# Patient Record
Sex: Female | Born: 1965 | Race: Black or African American | Hispanic: No | Smoking: Never smoker
Health system: Southern US, Community
[De-identification: ages and names within clinical notes are randomized; demographics above are authoritative.]

## PROBLEM LIST (undated history)

## (undated) ENCOUNTER — Emergency Department (HOSPITAL_COMMUNITY): Admission: EM | Payer: BLUE CROSS/BLUE SHIELD | Source: Home / Self Care

## (undated) DIAGNOSIS — M85611 Other cyst of bone, right shoulder: Secondary | ICD-10-CM

## (undated) DIAGNOSIS — E119 Type 2 diabetes mellitus without complications: Secondary | ICD-10-CM

## (undated) DIAGNOSIS — I10 Essential (primary) hypertension: Secondary | ICD-10-CM

## (undated) DIAGNOSIS — G43909 Migraine, unspecified, not intractable, without status migrainosus: Secondary | ICD-10-CM

## (undated) DIAGNOSIS — F1020 Alcohol dependence, uncomplicated: Secondary | ICD-10-CM

## (undated) DIAGNOSIS — M19019 Primary osteoarthritis, unspecified shoulder: Secondary | ICD-10-CM

## (undated) DIAGNOSIS — M75101 Unspecified rotator cuff tear or rupture of right shoulder, not specified as traumatic: Secondary | ICD-10-CM

## (undated) DIAGNOSIS — K219 Gastro-esophageal reflux disease without esophagitis: Secondary | ICD-10-CM

## (undated) HISTORY — PX: CHOLECYSTECTOMY: SHX55

## (undated) HISTORY — PX: ABDOMINAL HYSTERECTOMY: SHX81

## (undated) HISTORY — PX: GASTRIC BYPASS: SHX52

## (undated) HISTORY — PX: TONSILLECTOMY: SUR1361

---

## 2009-01-02 ENCOUNTER — Emergency Department (HOSPITAL_COMMUNITY): Admission: EM | Admit: 2009-01-02 | Discharge: 2009-01-02 | Payer: Self-pay | Admitting: Family Medicine

## 2009-01-24 ENCOUNTER — Emergency Department (HOSPITAL_COMMUNITY): Admission: EM | Admit: 2009-01-24 | Discharge: 2009-01-24 | Payer: Self-pay | Admitting: Family Medicine

## 2013-04-24 ENCOUNTER — Encounter (HOSPITAL_COMMUNITY): Payer: Self-pay | Admitting: Emergency Medicine

## 2013-04-24 ENCOUNTER — Emergency Department (HOSPITAL_COMMUNITY)
Admission: EM | Admit: 2013-04-24 | Discharge: 2013-04-24 | Disposition: A | Discharge status: Other Acute Inpatient Hospital | Payer: BC Managed Care – PPO | Attending: Emergency Medicine | Admitting: Emergency Medicine

## 2013-04-24 ENCOUNTER — Inpatient Hospital Stay (HOSPITAL_COMMUNITY)
Admission: AD | Admit: 2013-04-24 | Discharge: 2013-04-28 | DRG: 897 | Disposition: A | Discharge status: Home / Self Care | Payer: Federal, State, Local not specified - Other | Source: Intra-hospital | Attending: Psychiatry | Admitting: Psychiatry

## 2013-04-24 ENCOUNTER — Encounter (HOSPITAL_COMMUNITY): Payer: Self-pay

## 2013-04-24 DIAGNOSIS — F10239 Alcohol dependence with withdrawal, unspecified: Principal | ICD-10-CM | POA: Diagnosis present

## 2013-04-24 DIAGNOSIS — F4310 Post-traumatic stress disorder, unspecified: Secondary | ICD-10-CM | POA: Diagnosis present

## 2013-04-24 DIAGNOSIS — F10229 Alcohol dependence with intoxication, unspecified: Secondary | ICD-10-CM | POA: Insufficient documentation

## 2013-04-24 DIAGNOSIS — F1020 Alcohol dependence, uncomplicated: Secondary | ICD-10-CM | POA: Diagnosis present

## 2013-04-24 DIAGNOSIS — R1084 Generalized abdominal pain: Secondary | ICD-10-CM | POA: Insufficient documentation

## 2013-04-24 DIAGNOSIS — R45851 Suicidal ideations: Secondary | ICD-10-CM | POA: Insufficient documentation

## 2013-04-24 DIAGNOSIS — F10939 Alcohol use, unspecified with withdrawal, unspecified: Principal | ICD-10-CM | POA: Diagnosis present

## 2013-04-24 DIAGNOSIS — K219 Gastro-esophageal reflux disease without esophagitis: Secondary | ICD-10-CM | POA: Diagnosis present

## 2013-04-24 DIAGNOSIS — G4700 Insomnia, unspecified: Secondary | ICD-10-CM | POA: Diagnosis present

## 2013-04-24 DIAGNOSIS — F411 Generalized anxiety disorder: Secondary | ICD-10-CM | POA: Insufficient documentation

## 2013-04-24 DIAGNOSIS — E119 Type 2 diabetes mellitus without complications: Secondary | ICD-10-CM | POA: Insufficient documentation

## 2013-04-24 DIAGNOSIS — R197 Diarrhea, unspecified: Secondary | ICD-10-CM | POA: Insufficient documentation

## 2013-04-24 DIAGNOSIS — F329 Major depressive disorder, single episode, unspecified: Secondary | ICD-10-CM | POA: Diagnosis present

## 2013-04-24 DIAGNOSIS — F3289 Other specified depressive episodes: Secondary | ICD-10-CM | POA: Insufficient documentation

## 2013-04-24 DIAGNOSIS — R350 Frequency of micturition: Secondary | ICD-10-CM | POA: Insufficient documentation

## 2013-04-24 DIAGNOSIS — F10929 Alcohol use, unspecified with intoxication, unspecified: Secondary | ICD-10-CM

## 2013-04-24 HISTORY — DX: Type 2 diabetes mellitus without complications: E11.9

## 2013-04-24 HISTORY — DX: Alcohol dependence, uncomplicated: F10.20

## 2013-04-24 LAB — URINALYSIS, ROUTINE W REFLEX MICROSCOPIC
BILIRUBIN URINE: NEGATIVE
GLUCOSE, UA: NEGATIVE mg/dL
HGB URINE DIPSTICK: NEGATIVE
Ketones, ur: NEGATIVE mg/dL
Leukocytes, UA: NEGATIVE
NITRITE: NEGATIVE
Protein, ur: NEGATIVE mg/dL
SPECIFIC GRAVITY, URINE: 1.004 — AB (ref 1.005–1.030)
Urobilinogen, UA: 0.2 mg/dL (ref 0.0–1.0)
pH: 5.5 (ref 5.0–8.0)

## 2013-04-24 LAB — RAPID URINE DRUG SCREEN, HOSP PERFORMED
Amphetamines: NOT DETECTED
BENZODIAZEPINES: NOT DETECTED
Barbiturates: NOT DETECTED
COCAINE: NOT DETECTED
Opiates: NOT DETECTED
TETRAHYDROCANNABINOL: NOT DETECTED

## 2013-04-24 LAB — COMPREHENSIVE METABOLIC PANEL
ALT: 33 U/L (ref 0–35)
AST: 42 U/L — ABNORMAL HIGH (ref 0–37)
Albumin: 4.3 g/dL (ref 3.5–5.2)
Alkaline Phosphatase: 122 U/L — ABNORMAL HIGH (ref 39–117)
BUN: 17 mg/dL (ref 6–23)
CALCIUM: 9.4 mg/dL (ref 8.4–10.5)
CO2: 27 meq/L (ref 19–32)
Chloride: 105 mEq/L (ref 96–112)
Creatinine, Ser: 0.95 mg/dL (ref 0.50–1.10)
GFR, EST AFRICAN AMERICAN: 81 mL/min — AB (ref >90)
GFR, EST NON AFRICAN AMERICAN: 70 mL/min — AB (ref >90)
GLUCOSE: 110 mg/dL — AB (ref 70–99)
Potassium: 4.4 mEq/L (ref 3.7–5.3)
SODIUM: 144 meq/L (ref 137–147)
TOTAL PROTEIN: 8 g/dL (ref 6.0–8.3)
Total Bilirubin: <0.2 mg/dL — ABNORMAL LOW (ref 0.3–1.2)

## 2013-04-24 LAB — CBC
HCT: 44 % (ref 36.0–46.0)
HEMOGLOBIN: 14.7 g/dL (ref 12.0–15.0)
MCH: 30.1 pg (ref 26.0–34.0)
MCHC: 33.4 g/dL (ref 30.0–36.0)
MCV: 90 fL (ref 78.0–100.0)
Platelets: 290 10*3/uL (ref 150–400)
RBC: 4.89 MIL/uL (ref 3.87–5.11)
RDW: 13 % (ref 11.5–15.5)
WBC: 6.4 10*3/uL (ref 4.0–10.5)

## 2013-04-24 LAB — SALICYLATE LEVEL: Salicylate Lvl: <2 mg/dL — ABNORMAL LOW (ref 2.8–20.0)

## 2013-04-24 LAB — ACETAMINOPHEN LEVEL: Acetaminophen (Tylenol), Serum: <15 ug/mL (ref 10–30)

## 2013-04-24 LAB — GLUCOSE, CAPILLARY: Glucose-Capillary: 101 mg/dL — ABNORMAL HIGH (ref 70–99)

## 2013-04-24 LAB — ETHANOL: Alcohol, Ethyl (B): 258 mg/dL — ABNORMAL HIGH (ref 0–11)

## 2013-04-24 MED ORDER — MAGNESIUM HYDROXIDE 400 MG/5ML PO SUSP: 30.0000 mL | Freq: Every day | ORAL | Status: DC | PRN

## 2013-04-24 MED ORDER — CHLORDIAZEPOXIDE HCL 25 MG PO CAPS
25.0000 mg | ORAL_CAPSULE | Freq: Four times a day (QID) | ORAL | Status: AC | PRN
Start: 2013-04-24 — End: 2013-04-27
  Administered 2013-04-25 – 2013-04-27 (×4): 25 mg via ORAL
  Filled 2013-04-24 (×4): qty 1

## 2013-04-24 MED ORDER — ADULT MULTIVITAMIN W/MINERALS CH
1.0000 | ORAL_TABLET | Freq: Every day | ORAL | Status: DC
  Administered 2013-04-25 – 2013-04-28 (×4): 1 via ORAL
  Filled 2013-04-24 (×6): qty 1

## 2013-04-24 MED ORDER — PANTOPRAZOLE SODIUM 40 MG PO TBEC
40.0000 mg | DELAYED_RELEASE_TABLET | Freq: Every day | ORAL | Status: DC
  Administered 2013-04-25 – 2013-04-28 (×4): 40 mg via ORAL
  Filled 2013-04-24 (×6): qty 1

## 2013-04-24 MED ORDER — CHLORDIAZEPOXIDE HCL 25 MG PO CAPS
25.0000 mg | ORAL_CAPSULE | Freq: Three times a day (TID) | ORAL | Status: AC
  Administered 2013-04-26 – 2013-04-27 (×3): 25 mg via ORAL
  Filled 2013-04-24 (×3): qty 1

## 2013-04-24 MED ORDER — LOPERAMIDE HCL 2 MG PO CAPS
2.0000 mg | ORAL_CAPSULE | ORAL | Status: AC | PRN
  Administered 2013-04-26: 4 mg via ORAL
  Administered 2013-04-26: 2 mg via ORAL
  Filled 2013-04-24: qty 2
  Filled 2013-04-24: qty 1

## 2013-04-24 MED ORDER — RAMELTEON 8 MG PO TABS
8.0000 mg | ORAL_TABLET | Freq: Every evening | ORAL | Status: DC | PRN
  Administered 2013-04-24: 8 mg via ORAL
  Filled 2013-04-24: qty 1

## 2013-04-24 MED ORDER — THIAMINE HCL 100 MG/ML IJ SOLN
100.0000 mg | Freq: Once | INTRAMUSCULAR | Status: AC
  Administered 2013-04-24: 100 mg via INTRAMUSCULAR
  Filled 2013-04-24: qty 2

## 2013-04-24 MED ORDER — CHLORDIAZEPOXIDE HCL 25 MG PO CAPS
25.0000 mg | ORAL_CAPSULE | Freq: Four times a day (QID) | ORAL | Status: AC
  Administered 2013-04-24 – 2013-04-26 (×6): 25 mg via ORAL
  Filled 2013-04-24 (×6): qty 1

## 2013-04-24 MED ORDER — CHLORDIAZEPOXIDE HCL 25 MG PO CAPS
25.0000 mg | ORAL_CAPSULE | ORAL | Status: AC
Start: 2013-04-27 — End: 2013-04-28
  Administered 2013-04-27 – 2013-04-28 (×2): 25 mg via ORAL
  Filled 2013-04-24 (×2): qty 1

## 2013-04-24 MED ORDER — ONDANSETRON 4 MG PO TBDP
4.0000 mg | ORAL_TABLET | Freq: Four times a day (QID) | ORAL | Status: AC | PRN
  Administered 2013-04-25 – 2013-04-27 (×6): 4 mg via ORAL
  Filled 2013-04-24 (×8): qty 1

## 2013-04-24 MED ORDER — ACETAMINOPHEN 325 MG PO TABS
650.0000 mg | ORAL_TABLET | Freq: Four times a day (QID) | ORAL | Status: DC | PRN
  Administered 2013-04-24 – 2013-04-25 (×3): 650 mg via ORAL
  Filled 2013-04-24 (×4): qty 2

## 2013-04-24 MED ORDER — VITAMIN B-1 100 MG PO TABS
100.0000 mg | ORAL_TABLET | Freq: Every day | ORAL | Status: DC
  Administered 2013-04-25 – 2013-04-28 (×4): 100 mg via ORAL
  Filled 2013-04-24 (×6): qty 1

## 2013-04-24 MED ORDER — CHLORDIAZEPOXIDE HCL 25 MG PO CAPS: 25.0000 mg | ORAL_CAPSULE | Freq: Every day | ORAL | Status: DC

## 2013-04-24 MED ORDER — FLUOXETINE HCL 20 MG PO CAPS
20.0000 mg | ORAL_CAPSULE | Freq: Every day | ORAL | Status: DC
  Administered 2013-04-24 – 2013-04-25 (×2): 20 mg via ORAL
  Filled 2013-04-24 (×5): qty 1

## 2013-04-24 MED ORDER — HYDROXYZINE HCL 25 MG PO TABS
25.0000 mg | ORAL_TABLET | Freq: Four times a day (QID) | ORAL | Status: AC | PRN
  Administered 2013-04-25 – 2013-04-27 (×3): 25 mg via ORAL
  Filled 2013-04-24 (×3): qty 1

## 2013-04-24 MED ORDER — ALUM & MAG HYDROXIDE-SIMETH 200-200-20 MG/5ML PO SUSP: 30.0000 mL | ORAL | Status: DC | PRN

## 2013-04-24 MED ORDER — METFORMIN HCL 500 MG PO TABS
500.0000 mg | ORAL_TABLET | Freq: Three times a day (TID) | ORAL | Status: DC
  Administered 2013-04-25 – 2013-04-28 (×11): 500 mg via ORAL
  Filled 2013-04-24 (×17): qty 1

## 2013-04-24 NOTE — ED Notes (Signed)
Called and left another voicemail for [place] transportation regarding [facility].

## 2013-04-24 NOTE — Progress Notes (Signed)
[facility] been [facility].  Dr. Dub MikesLugo is the accepting doctor.  Writer informed the nurse of the patients disposition.  The nurse working with the patient will arrange transportation.  The incoming TTS counselor will be complete the support paperwork.

## 2013-04-24 NOTE — ED Notes (Signed)
TTS speaking with pt/pt's family.

## 2013-04-24 NOTE — ED Notes (Addendum)
Suicidal intoxicated patient sent from home saying she wants to take pills and die.  Patient has history of alcoholism and depression.  Patient takes prozac for depression.

## 2013-04-24 NOTE — BH Assessment (Signed)
Patient signed support paperwork consisting of 'voluntary admission and consent for treatment' and 'consent to release information' to both daughter and sponsor and sponsor at 4:08 PM. Paperwork [facility] at 4:15PM. Paperwork given to patient's RN, Eliezer Loftsarrie Hall, as she will arrange transportation thought [employer] [employer] with [facility]. Carney Bernatherine C Luverna Degenhart, LCSW

## 2013-04-24 NOTE — ED Notes (Signed)
Left message with [facility] [place] [facility] regarding [facility].

## 2013-04-24 NOTE — ED Provider Notes (Signed)
CSN: [id number]     Arrival date & time 04/24/13  1329 History  This chart was scribed for non-physician practitioner working with Gilda Crease, MD by Ashley Jacobs, ED scribe. This patient was seen in room WTR4/WLPT4 and the patient's care was started at 1:43 PM.  First MD Initiated Contact with Patient 04/24/13 1333     Chief Complaint  Patient presents with  . Suicidal     (Consider location/radiation/quality/duration/timing/severity/associated sxs/prior Treatment) The history is provided by the patient and medical records. No language interpreter was used.   HPI Comments: Janice Santos is a 48 y.o. female who presents to the Emergency Department via EMS after her daughter reported the patient stated  "I just want to take pills and die".  Stated her family would be better off without her.  Pt states, " I don't know why I'm here".  She is unaware of how she arrived to the ED. States she is depressed and has been depressed for a long time.  Denies SOB or any pain.    Level V caveat for intoxication Past Medical History  Diagnosis Date  . Alcoholism   . Diabetes mellitus without complication    History reviewed. No pertinent past surgical history. No family history on file. History  Substance Use Topics  . Smoking status: Not on file  . Smokeless tobacco: Not on file  . Alcohol Use: Not on file   OB History   Grav Para Term Preterm Abortions TAB SAB Ect Mult Living                 Review of Systems  Unable to perform ROS: Psychiatric disorder  Cardiovascular: Negative for chest pain.  Gastrointestinal: Positive for diarrhea. Negative for vomiting and abdominal pain.  Genitourinary: Positive for frequency.  Psychiatric/Behavioral: Positive for dysphoric mood.      Allergies  Review of patient's allergies indicates not on file.  Home Medications  No current outpatient prescriptions on file. BP 116/76  Pulse 108  Temp(Src) 98.6 F (37 C) (Oral)  Resp  20  SpO2 97% Physical Exam  Nursing note and vitals reviewed. Constitutional: She is oriented to person, place, and time. She appears well-developed and well-nourished. No distress.  Pt is wearing her dress backwards.  HENT:  Head: Normocephalic and atraumatic.  Eyes: EOM are normal.  Neck: Normal range of motion. Neck supple.  Cardiovascular: Normal rate and regular rhythm.   Pulmonary/Chest: Effort normal and breath sounds normal. No respiratory distress. She has no wheezes. She has no rales.  Abdominal: Soft. She exhibits no distension. There is tenderness (diffuse lower abdominal pain). There is no rebound and no guarding.  Musculoskeletal: Normal range of motion.  Neurological: She is alert and oriented to person, place, and time.  Skin: Skin is warm and dry. She is not diaphoretic.  Psychiatric: She exhibits a depressed mood. She expresses suicidal ideation. She expresses suicidal plans.  Crying throughout interview with head in hands.      ED Course  Procedures (including critical care time) DIAGNOSTIC STUDIES: Oxygen Saturation is 97% on room air, normal by my interpretation.    COORDINATION OF CARE:  1:48 PM Discussed course of care with pt . Pt understands and agrees.   Labs Review Labs Reviewed  ETHANOL - Abnormal; Notable for the following:    Alcohol, Ethyl (B) 258 (*)    All other components within normal limits  COMPREHENSIVE METABOLIC PANEL - Abnormal; Notable for the following:    Glucose, Bld  110 (*)    AST 42 (*)    Alkaline Phosphatase 122 (*)    Total Bilirubin <0.2 (*)    GFR calc non Af Amer 70 (*)    GFR calc Af Amer 81 (*)    All other components within normal limits  SALICYLATE LEVEL - Abnormal; Notable for the following:    Salicylate Lvl <2.0 (*)    All other components within normal limits  URINALYSIS, ROUTINE W REFLEX MICROSCOPIC - Abnormal; Notable for the following:    Specific Gravity, Urine 1.004 (*)    All other components within  normal limits  CBC  URINE RAPID DRUG SCREEN (HOSP PERFORMED)  ACETAMINOPHEN LEVEL   Imaging Review No results found.  EKG Interpretation    Date/Time:  Thursday April 24 2013 14:13:01 EST Ventricular Rate:  116 PR Interval:  155 QRS Duration: 94 QT Interval:  335 QTC Calculation: 465 R Axis:   -44 Text Interpretation:  Sinus tachycardia Left axis deviation Anteroseptal infarct, old No previous tracing Confirmed by POLLINA  MD, CHRISTOPHER ([id number]) on 04/24/2013 2:17:50 PM           5:12 PM Patient found to be attempting to hang herself with the cords in the room.  Pt has been accepted at behavioral health.    MDM  I personally performed the services described in this documentation, which was scribed in my presence. The recorded information has been reviewed and is accurate.   Final diagnoses:  Suicidal ideation  Alcohol intoxication    Pt is intoxicated, ETOH 258, brought in to ED after she told daughter this morning she wanted to take pills and kill herself.  She was also found to be attempted to strangle herself with cords in the exam room.  Medically cleared for inpatient psychiatric care.  Accepted to Behavioral Health by Dr Dub MikesLugo.      Trixie Dredgemily Bryten Maher, PA-C 04/24/13 1719

## 2013-04-24 NOTE — Progress Notes (Signed)
Writer informed Dr. Ladona Ridgelaylor and Catha NottinghamJamison, NP of the consult request.

## 2013-04-24 NOTE — Consult Note (Signed)
Subjective:  Patient has been drinking Merlot daily.  She has tried to maintain sobriety with AA and a sponsor with little to no success.  Janice Santos drank last night and took 3 Tylenol PM to sleep but blacked out.  She called her daughter and said she wanted to kill herself.  Her daughter called EMS and brought her to the ED.  Psychiatric Specialty Exam: Physical Exam  Constitutional: She is oriented to person, place, and time. She appears well-developed and well-nourished.  HENT:  Head: Normocephalic and atraumatic.  Neck: Normal range of motion.  Respiratory: Effort normal.  Musculoskeletal: Normal range of motion.  Neurological: She is alert and oriented to person, place, and time.    Review of Systems  Constitutional: Negative.   HENT: Negative.   Eyes: Negative.   Respiratory: Negative.   Cardiovascular: Negative.   Gastrointestinal: Positive for diarrhea.  Genitourinary: Negative.   Musculoskeletal: Negative.   Skin: Negative.   Neurological: Negative.   Endo/Heme/Allergies: Negative.   Psychiatric/Behavioral: Positive for depression and substance abuse. The patient is nervous/anxious.     Blood pressure 116/76, pulse 108, temperature 98.6 F (37 C), temperature source Oral, resp. rate 20, SpO2 97.00%.There is no height or weight on file to calculate BMI.  General Appearance: Casual  Eye Contact::  Fair  Speech:  Normal Rate  Volume:  Normal  Mood:  Depressed  Affect:  Tearful  Thought Process:  Coherent  Orientation:  Full (Time, Place, and Person)  Thought Content:  WDL  Suicidal Thoughts:  No  Homicidal Thoughts:  No  Memory:  Immediate;   Fair Recent;   Poor Remote;   Fair  Judgement:  Poor  Insight:  Fair  Psychomotor Activity:  Decreased  Concentration:  Fair  Recall:  Fair  Akathisia:  No  Handed:  Right  AIMS (if indicated):     Assets:  Leisure Time Physical Health Resilience Social Support  Sleep:      Herminio HeadsJamison Y. Lord, PMH-NP 04/24/2013

## 2013-04-25 ENCOUNTER — Encounter (HOSPITAL_COMMUNITY): Payer: Self-pay | Admitting: Psychiatry

## 2013-04-25 DIAGNOSIS — F1994 Other psychoactive substance use, unspecified with psychoactive substance-induced mood disorder: Secondary | ICD-10-CM

## 2013-04-25 DIAGNOSIS — F329 Major depressive disorder, single episode, unspecified: Secondary | ICD-10-CM | POA: Diagnosis present

## 2013-04-25 DIAGNOSIS — F4310 Post-traumatic stress disorder, unspecified: Secondary | ICD-10-CM | POA: Diagnosis present

## 2013-04-25 NOTE — [facility] Suicide Risk Assessment (Signed)
[facility] INPATIENT:  Family/Significant Other Suicide Prevention Education  Suicide Prevention Education:  Education Completed; Patient daughter Estill Batten(Ikkia Hardoson) has been identified by the patient as the family member/significant other with whom the patient will be residing, and identified as the person(s) who will aid the patient in the event of a mental health crisis (suicidal ideations/suicide attempt).  With written consent from the patient, the family member/significant other has been provided the following suicide prevention education, prior to the and/or following the discharge of the patient.  The suicide prevention education provided includes the following:  Suicide risk factors  Suicide prevention and interventions  National Suicide Hotline telephone number  [facility] assessment telephone number  [facility] Emergency Assistance 911  [facility] and/or Residential Mobile Crisis Unit telephone number  Request made of family/significant other to:  Remove weapons (e.g., guns, rifles, knives), all items previously/currently identified as safety concern.    Remove drugs/medications (over-the-counter, prescriptions, illicit drugs), all items previously/currently identified as a safety concern.  The family member/significant other verbalizes understanding of the suicide prevention education information provided.  The family member/significant other agrees to remove the items of safety concern listed above.  Raye SorrowCoble, Neilani Duffee N 04/25/2013, 12:39 PM

## 2013-04-25 NOTE — Progress Notes (Signed)
Patient ID: Janice Santos, female   DOB: 12/26/1965, 48 y.o.   MRN: [id number] Admission note: D:Patient is a  Voluntary admission in no acute distress for alcohol detox. Pt report consuming a bottle of wine daily. Pt states sober since March 20, 2013. [place] close to her daughter and grandchildren. Pt stated she drinks till she blackout but denies seizures.  A: Pt admitted to unit per protocol, skin assessment and belonging search done. No skin issues noted. Consent signed by pt. Pt educated on therapeutic milieu rules. Pt was introduced to milieu by nursing staff. Fall risk safety plan explained to the patient. Meal offered and pt accepted.15 minutes checks started for safety.  R: Pt was receptive to education. Writer offered support.

## 2013-04-25 NOTE — Progress Notes (Signed)
LCSW spoke with patient daughter at length regarding concerns and expectations of treatment. Daughter reports things have been going well at home and has enjoyed having her mom here and living with them. Reports she wants her to return and wants to do everything to help her mom with regards to outpatient treatment.  Daughter reports she has seen her mother spiral more downhill in the last 5 years and is fearful of losing her mom.  LCSW and daughter discussed addiction, daughter has been looking up more information about addiction.  Reports strong support for mother, no current concerns, but will be visiting this weekend.  Daughter is agreeable to be part of treatment and will give more insight after the weekend and once patient has complete more in treatment.  Suicide education was complete and at this time daughter has no questions or concerns.  Ashley JacobsHannah Nail, MSW, LCSW Clinical Lead [phone]

## 2013-04-25 NOTE — [facility] Suicide Risk Assessment (Signed)
Suicide Risk Assessment  Admission Assessment     Nursing information obtained from:  Patient Demographic factors:  Divorced or widowed Current Mental Status:  Self-harm thoughts;Self-harm behaviors Loss Factors:  NA Historical Factors:  Domestic violence Risk Reduction Factors:  Employed;Positive social support;Living with another person, especially a relative Total Time spent with patient: 1 hour  CLINICAL FACTORS:   Depression:   Comorbid alcohol abuse/dependence Insomnia Alcohol/Substance Abuse/Dependencies  COGNITIVE FEATURES THAT CONTRIBUTE TO RISK:  Closed-mindedness Polarized thinking Thought constriction (tunnel vision)    SUICIDE RISK:   Moderate:  Frequent suicidal ideation with limited intensity, and duration, some specificity in terms of plans, no associated intent, good self-control, limited dysphoria/symptomatology, some risk factors present, and identifiable protective factors, including available and accessible social support.  PLAN OF CARE: Supportive approach/coping skills/ relapse prevention                               [facility] Detox protocol                               Reassess and address the co morbidities  I certify that inpatient services furnished can reasonably be expected to improve the patient's condition.  Whittney Steenson A 04/25/2013, 3:31 PM

## 2013-04-25 NOTE — ED Provider Notes (Signed)
Medical screening examination/treatment/procedure(s) were performed by non-physician practitioner and as supervising physician I was immediately available for consultation/collaboration.  EKG Interpretation    Date/Time:  Thursday April 24 2013 14:13:01 EST Ventricular Rate:  116 PR Interval:  155 QRS Duration: 94 QT Interval:  335 QTC Calculation: 465 R Axis:   -44 Text Interpretation:  Sinus tachycardia Left axis deviation Anteroseptal infarct, old No previous tracing Confirmed by Blinda LeatherwoodPOLLINA  MD, CHRISTOPHER ([id number]) on 04/24/2013 2:17:50 PM              Gilda Creasehristopher J. Pollina, MD 04/25/13 [phone]

## 2013-04-25 NOTE — [facility] Group Notes (Signed)
[facility] LCSW Group Therapy  04/25/2013  1:15 PM   Type of Therapy:  Group Therapy  Participation Level:  Active  Participation Quality:  Appropriate, Attentive, Sharing and Supportive  Affect:  Depressed and Flat  Cognitive:  Alert and Oriented  Insight:  Developing/Improving and Engaged  Engagement in Therapy:  Developing/Improving and Engaged  Modes of Intervention:  Clarification, Confrontation, Discussion, Education, Exploration, Limit-setting, Orientation, Problem-solving, Rapport Building, Dance movement psychotherapisteality Testing, Socialization and Support  Summary of Progress/Problems: The topic for today was feelings about relapse.  Pt discussed what relapse prevention is to them and identified triggers that they are on the path to relapse.  Pt processed their feeling towards relapse and was able to relate to peers.  Pt discussed coping skills that can be used for relapse prevention.  Pt was able to process what led to this admission, sharing that she was sober for 30 days and doesn't know why she relapsed.  Pt states that she wants to understand why she was doing so well and the sight of the wine bottle triggered her to buy it.  Pt states that she was able to stay sober with AA meetings and a sponsor and was able to realize that she should've called her sponsor while in the store before buying it.  Pt participated in a discussion with peers about having control over her addiction/disorders vs it having control over her.  Pt was able to identify what she is able to control in her addiction, to prevent future relapse.  Pt actively participated and was engaged in group discussion.    Janice IvanChelsea Horton, LCSW 04/25/2013 2:13 PM

## 2013-04-25 NOTE — Progress Notes (Signed)
Adult Psychoeducational Group Note  Date:  04/25/2013 Time:  12:45 PM  Group Topic/Focus:  Early Warning Signs:   The focus of this group is to help patients identify signs or symptoms they exhibit before slipping into an unhealthy state or crisis.  Participation Level:  Active  Participation Quality:  Appropriate  Affect:  Appropriate  Cognitive:  Appropriate  Insight: Appropriate  Engagement in Group:  Engaged  Modes of Intervention:  Discussion, Education and Support  Additional Comments:  Pt stated she does not know what happened to cause her to relapse. Pt stated she had one month clean and went into [facility] for some items that she needed and ended up also picking up a bottle of wine. Pt stated she does not know how to cope or dealt with her addiction and just seeing the wine in the store must have been a trigger for her.  Caswell CorwinOwen, Chinonso Linker C 04/25/2013, 12:45 PM

## 2013-04-25 NOTE — Tx Team (Signed)
Initial Interdisciplinary Treatment Plan  PATIENT STRENGTHS: (choose at least two) Ability for insight Average or above average intelligence Capable of independent living Motivation for treatment/growth Supportive family/friends  PATIENT STRESSORS: Substance abuse   PROBLEM LIST: Problem List/Patient Goals Date to be addressed Date deferred Reason deferred Estimated date of resolution  Alcohol dependence 04/24/2013     depression 04/24/2013     anxiety 04/24/2013                                           DISCHARGE CRITERIA:  Ability to meet basic life and health needs Improved stabilization in mood, thinking, and/or behavior Motivation to continue treatment in a less acute level of care Withdrawal symptoms are absent or subacute and managed without 24-hour nursing intervention  PRELIMINARY DISCHARGE PLAN: Attend PHP/IOP Attend 12-step recovery group Return to previous living arrangement Return to previous work or school arrangements  PATIENT/FAMIILY INVOLVEMENT: This treatment plan has been presented to and reviewed with the patient, Janice Santos, and/or family member, The patient and family have been given the opportunity to ask questions and make suggestions.  JEHU-APPIAH, Norval Slaven K 04/25/2013, 1:42 AM

## 2013-04-25 NOTE — Tx Team (Signed)
Interdisciplinary Treatment Plan Update (Adult)  Date: 04/25/2013  Time Reviewed:  9:45 AM  Progress in Treatment: Attending groups: Yes Participating in groups:  Yes Taking medication as prescribed:  Yes Tolerating medication:  Yes Family/Significant othe contact made: CSW assessing Patient understands diagnosis:  Yes Discussing patient identified problems/goals with staff:  Yes Medical problems stabilized or resolved:  Yes Denies suicidal/homicidal ideation: Yes Issues/concerns per patient self-inventory:  Yes Other:  New problem(s) identified: N/A  Discharge Plan or Barriers: CSW assessing for appropriate referrals.    Reason for Continuation of Hospitalization: Anxiety Depression Detox Medication Stabilization  Comments: N/A  Estimated length of stay: 3-5 days   For review of initial/current patient goals, please see plan of care.  Attendees: Patient:     Family:     Physician:  Dr. Dub MikesLugo 04/25/2013 9:46 AM   Nursing:   Waynetta SandyJan Wright, RN 04/25/2013 9:46 AM   Clinical Social Worker:  Reyes Ivanhelsea Horton, LCSW 04/25/2013 9:46 AM   Other: Serena ColonelAggie Nwoko, NP 04/25/2013 9:46 AM   Other: Harold Barbanonecia Byrd, RN 04/25/2013 9:46 AM   Other:  Onnie BoerJennifer Clark, case manager 04/25/2013 9:49 AM   Other:     Other:    Other:    Other:    Other:    Other:    Other:     Scribe for Treatment Team:   Carmina MillerHorton, Darina Hartwell Nicole, 04/25/2013 , 9:46 AM

## 2013-04-25 NOTE — Progress Notes (Signed)
Patient ID: Janice Santos, female   DOB: 08/07/1965, 48 y.o.   MRN: [id number] D. The patient spent the evening resting in bed. Stated that she was still a little nauseous. Reports feeling intermittently sweating and chilled. No tremors noted. BP elevated. A. Medicated for Headache. VS and CIWA taken. Informed about the AA/NA group this evening and encouraged to attend if she felt well enough. R. The patient did not attend evening group. Reports relief from Tylenol. Will continue to monitor.
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
## 2013-04-25 NOTE — [facility] Group Notes (Signed)
[facility] Dis[facility] Planning [facility] Note   04/25/2013  8:45 AM  Participation Quality:  Did Not Attend - pt was meeting with another CSW to complete PSA  Reyes IvanChelsea Horton, LCSW 04/25/2013 9:40 AM

## 2013-04-25 NOTE — Progress Notes (Signed)
Recreation Therapy Notes  Date: 02.20.2015 Time: 2:45pm Location: [address] Dayroom   Group Topic: Communication, Team Building, Problem Solving  Goal Area(s) Addresses:  Patient will effectively work with peer towards shared goal.  Patient will identify skill used to make activity successful.  Patient will identify how skills used during activity can be used to reach post d/c goals.   Behavioral Response: Did not attend.   Marykay Lexenise L Davidlee Jeanbaptiste, LRT/CTRS  Caidon Foti L 04/25/2013 4:09 PM
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
## 2013-04-26 MED ORDER — FLUOXETINE HCL 20 MG PO CAPS: 40.0000 mg | ORAL_CAPSULE | Freq: Every morning | ORAL | Status: DC

## 2013-04-26 MED ORDER — MIRTAZAPINE 30 MG PO TABS
30.0000 mg | ORAL_TABLET | Freq: Every day | ORAL | Status: DC
  Administered 2013-04-26 – 2013-04-27 (×2): 30 mg via ORAL
  Filled 2013-04-26 (×2): qty 1
  Filled 2013-04-26: qty 14
  Filled 2013-04-26 (×2): qty 1

## 2013-04-26 MED ORDER — ASPIRIN-ACETAMINOPHEN-CAFFEINE 250-250-65 MG PO TABS
2.0000 | ORAL_TABLET | Freq: Four times a day (QID) | ORAL | Status: DC | PRN
  Administered 2013-04-27 – 2013-04-28 (×3): 2 via ORAL
  Filled 2013-04-26: qty 1
  Filled 2013-04-26 (×3): qty 2

## 2013-04-26 MED ORDER — SUCRALFATE 1 GM/10ML PO SUSP
1.0000 g | Freq: Three times a day (TID) | ORAL | Status: DC
  Administered 2013-04-26 – 2013-04-28 (×9): 1 g via ORAL
  Filled 2013-04-26 (×12): qty 10

## 2013-04-26 MED ORDER — FLUOXETINE HCL 20 MG PO CAPS
40.0000 mg | ORAL_CAPSULE | Freq: Every day | ORAL | Status: DC
  Filled 2013-04-26: qty 2

## 2013-04-26 MED ORDER — FLUOXETINE HCL 20 MG PO CAPS
40.0000 mg | ORAL_CAPSULE | Freq: Every morning | ORAL | Status: DC
  Administered 2013-04-27 – 2013-04-28 (×2): 40 mg via ORAL
  Filled 2013-04-26: qty 28
  Filled 2013-04-26 (×3): qty 2

## 2013-04-26 NOTE — Progress Notes (Signed)
Patient did attend the evening speaker AA meeting.  

## 2013-04-26 NOTE — [facility] Group Notes (Signed)
[facility] Group Notes:  (Nursing/MHT/Case Management/Adjunct)  Date:  04/26/2013  Time:  10:49 AM  Type of Therapy:  Nurse Education  Participation Level:  Active  Participation Quality:  Appropriate  Affect:  Appropriate  Cognitive:  Alert  Insight:  Appropriate  Engagement in Group:  Engaged  Modes of Intervention:  Discussion  Summary of Progress/Problems:pt shared some coping skills and also shared she resorts to drinking to cover up her sadddness and depression  Nicole CellaWebb, Allessandra Bernardi Guyes 04/26/2013, 10:49 AM

## 2013-04-26 NOTE — Progress Notes (Signed)
[facility] MD Progress Note  04/26/2013 3:43 PM Janice Santos  MRN:  [id number] Subjective:  Janice Santos continues ot have a hard time. She did not sleep too well last night and she is having nausea and vomiting. She also endorses she is having headache. States that her family is very upset and frightened by what happened. She is worried herself. Expressed commitment to sobriety. She admits to the underlying depression not sure if Prozac is working for her anymore. Diagnosis:   DSM5: Schizophrenia Disorders:  none Obsessive-Compulsive Disorders:  none Trauma-Stressor Disorders:  Posttraumatic Stress Disorder (309.81) Substance/Addictive Disorders:  Alcohol Related Disorder - Severe (303.90) Depressive Disorders:  Major Depressive Disorder - Moderate (296.22) Total Time spent with patient: 30 minutes  Axis I: Substance Induced Mood Disorder  ADL's:  Intact  Sleep: Poor  Appetite:  Poor  Suicidal Ideation:  Plan:  denies Intent:  denies Means:  denies Homicidal Ideation:  Plan:  denies Intent:  denies Means:  denies AEB (as evidenced by):  Psychiatric Specialty Exam: Physical Exam  Review of Systems  Constitutional: Positive for malaise/fatigue.  Eyes: Negative.   Respiratory: Negative.   Cardiovascular: Negative.   Gastrointestinal: Positive for nausea and vomiting.  Genitourinary: Negative.   Musculoskeletal: Negative.   Skin: Negative.   Neurological: Positive for headaches.  Endo/Heme/Allergies: Negative.   Psychiatric/Behavioral: Positive for depression and substance abuse. The patient is nervous/anxious and has insomnia.     Blood pressure 139/86, pulse 85, temperature 98.1 F (36.7 C), temperature source Oral, resp. rate 16, height 5' 7.72" (1.72 m), weight 92.987 kg (205 lb).Body mass index is 31.43 kg/(m^2).  General Appearance: Fairly Groomed  Patent attorney::  Fair  Speech:  Clear and Coherent, Slow and not spontaneous  Volume:  Decreased  Mood:  Anxious, Depressed and  with nausea, headache  Affect:  anxious, worried  Thought Process:  Coherent and Goal Directed  Orientation:  Full (Time, Place, and Person)  Thought Content:  symptoms, worries, concerns  Suicidal Thoughts:  No  Homicidal Thoughts:  No  Memory:  Immediate;   Fair Recent;   Fair Remote;   Fair  Judgement:  Fair  Insight:  Present  Psychomotor Activity:  Restlessness  Concentration:  Fair  Recall:  [employer] of Knowledge:Fair  Language: Fair  Akathisia:  No  Handed:    AIMS (if indicated):     Assets:  Desire for Improvement  Sleep:  Number of Hours: 6.5   Musculoskeletal: Strength & Muscle Tone: within normal limits Gait & Station: normal Patient leans: N/A  Current Medications: Current Facility-Administered Medications  Medication Dose Route Frequency Provider Last Rate Last Dose  . acetaminophen (TYLENOL) tablet 650 mg  650 mg Oral Q6H PRN Nanine Means, NP   650 mg at 04/25/13 2142  . alum & mag hydroxide-simeth (MAALOX/MYLANTA) 200-200-20 MG/5ML suspension 30 mL  30 mL Oral Q4H PRN Nanine Means, NP      . aspirin-acetaminophen-caffeine (EXCEDRIN MIGRAINE) per tablet 2 tablet  2 tablet Oral Q6H PRN Rachael Fee, MD      . chlordiazePOXIDE (LIBRIUM) capsule 25 mg  25 mg Oral Q6H PRN Nanine Means, NP   25 mg at 04/25/13 1805  . chlordiazePOXIDE (LIBRIUM) capsule 25 mg  25 mg Oral TID Nanine Means, NP   25 mg at 04/26/13 1200   Followed by  . [START ON 04/27/2013] chlordiazePOXIDE (LIBRIUM) capsule 25 mg  25 mg Oral BH-qamhs Nanine Means, NP       Followed by  . [  START ON 04/29/2013] chlordiazePOXIDE (LIBRIUM) capsule 25 mg  25 mg Oral Daily Nanine MeansJamison Lord, NP      . FLUoxetine (PROZAC) capsule 20 mg  20 mg Oral QHS Kristeen MansFran E Hobson, NP   20 mg at 04/25/13 2142  . hydrOXYzine (ATARAX/VISTARIL) tablet 25 mg  25 mg Oral Q6H PRN Nanine MeansJamison Lord, NP   25 mg at 04/26/13 0839  . loperamide (IMODIUM) capsule 2-4 mg  2-4 mg Oral PRN Nanine MeansJamison Lord, NP   4 mg at 04/26/13 0825  . magnesium  hydroxide (MILK OF MAGNESIA) suspension 30 mL  30 mL Oral Daily PRN Nanine MeansJamison Lord, NP      . metFORMIN (GLUCOPHAGE) tablet 500 mg  500 mg Oral TID Kristeen MansFran E Hobson, NP   500 mg at 04/26/13 1201  . mirtazapine (REMERON) tablet 30 mg  30 mg Oral QHS Rachael FeeIrving A Hartleigh Edmonston, MD      . multivitamin with minerals tablet 1 tablet  1 tablet Oral Daily Nanine MeansJamison Lord, NP   1 tablet at 04/26/13 [phone]  . ondansetron (ZOFRAN-ODT) disintegrating tablet 4 mg  4 mg Oral Q6H PRN Nanine MeansJamison Lord, NP   4 mg at 04/26/13 0825  . pantoprazole (PROTONIX) EC tablet 40 mg  40 mg Oral Daily Kristeen MansFran E Hobson, NP   40 mg at 04/26/13 0820  . sucralfate (CARAFATE) 1 GM/10ML suspension 1 g  1 g Oral TID WC & HS Rachael FeeIrving A Terren Jandreau, MD   1 g at 04/26/13 1313  . thiamine (VITAMIN B-1) tablet 100 mg  100 mg Oral Daily Nanine MeansJamison Lord, NP   100 mg at 04/26/13 [id number]    Lab Results:  Results for orders placed during the hospital encounter of 04/24/13 (from the past 48 hour(s))  GLUCOSE, CAPILLARY     Status: Abnormal   Collection Time    04/24/13  9:05 PM      Result Value Ref Range   Glucose-Capillary 101 (*) 70 - 99 mg/dL    Physical Findings: AIMS: Facial and Oral Movements Muscles of Facial Expression: None, normal Lips and Perioral Area: None, normal Jaw: None, normal Tongue: None, normal,Extremity Movements Upper (arms, wrists, hands, fingers): None, normal Lower (legs, knees, ankles, toes): None, normal, Trunk Movements Neck, shoulders, hips: None, normal, Overall Severity Severity of abnormal movements (highest score from questions above): None, normal Incapacitation due to abnormal movements: None, normal Patient's awareness of abnormal movements (rate only patient's report): No Awareness, Dental Status Current problems with teeth and/or dentures?: No Does patient usually wear dentures?: No  CIWA:  CIWA-Ar Total: 7 COWS:     Treatment Plan Summary: Daily contact with patient to assess and evaluate symptoms and progress in  treatment Medication management  Plan: Supportive appraoch/coping skills/relapse prevention           Pursue Detox           Remeron 30 mg at HS           She uses [employer] (a lot every day) will try Excedrin migraine           Reassess, consider increasing the Prozac to 40 mg daily  Medical Decision Making Problem Points:  Established problem, worsening (2), Review of last therapy session (1) and Review of psycho-social stressors (1) Data Points:  Review of medication regiment & side effects (2) Review of new medications or change in dosage (2)  I certify that inpatient services furnished can reasonably be expected to improve the patient's condition.   Ghassan Coggeshall A 04/26/2013,  3:43 PM

## 2013-04-26 NOTE — Progress Notes (Addendum)
Pt states her energy level is low today. She stated she has a headache, feels nauseated  and has been having diarrhea. Pt was medicated for all the above symptoms. She was encouraged to drink po fluid due to her low BP when standing and her elevated heart rate.Pt does contract for safety and denies SI and HI. She stated she has been drinking times 8 years -one to two bottles of merlot a day.Pt did state she is ready to stop drinking and is so glad she is here. Pt stated she resorts to drinking to help sleep at night . She drinks due to feeling lonely,. Sad and depressed. Pt does have good support loves to ride motorcycles and presently is in school for Criminal Justice.Spoke with Dr. Dub MikesLugo concerning pts request for sleep medication and for a change in her medication-Pt vomitted after eating lunch and states this happens after each meal . MD made aware and ordered carafate for the pt. Pt was given gatorade to drink as well. 2:30pm - Pt was instructed to tell nursing if she continues to vomit after each meal after starting on carafate. Pts vomitus was dark in color but not appear coffee ground. MD made aware.

## 2013-04-26 NOTE — [facility] Group Notes (Signed)
[facility] Group Notes:  (Nursing/MHT/Case Management/Adjunct)  Date:  04/26/2013  Time:  2:18 PM  Type of Therapy:  Psychoeducational Skills  Participation Level:  Active  Participation Quality:  Appropriate  Affect:  Appropriate  Cognitive:  Alert  Insight:  Appropriate  Engagement in Group:  Engaged  Modes of Intervention:  Education  Summary of Progress/Problems:Pt watched the video on addiction and how it can take over your life.   Rodman KeyWebb, Kaylan Yates [facility] 04/26/2013, 2:18 PM

## 2013-04-26 NOTE — [facility] Group Notes (Signed)
[facility] Group Notes:  (Clinical Social Work)  04/26/2013     10-11AM  Summary of Progress/Problems:   The main focus of today's process group was for the patient to identify ways in which they have in the past sabotaged their own recovery. Motivational Interviewing was utilized to help patients explore the perceived benefits and costs of their substance use.  The use of PLANNED coping skills was discussed in depth as a means to address each of the dual diagnoses the patient is experiencing.  The patient expressed that if the use of substances were stopped, she would expect to feel depressed, agitated, lonely, sad, and to not be able to sleep.  She has been taking Prozac a long time, and feels it is no longer working, even at the higher dose she was placed on by her PCP.  She asked questions about the medications for depression, and finally admitted she is looking for a "happy pill" which the other patients assured her is not forthcoming.  She also admitted she stopped her meds because they weren't working, and CSW provided education about how her providers are forced to focus on the addiction while it is active, since it is masking the psychiatric issues.  She expressed understanding.  She enjoys spending time with her grandkids, went into detail about what they do.  She also is in school and "for the most part" holds herself accountable for finishing homework.  She likes staying fit and enjoys basketball -- CSW suggested joining a women's basketball league or some other group sport activity where teammates could be another reason to stay sober, as well as helping with the depression.  Type of Therapy:  Group Therapy - Process   Participation Level:  Active  Participation Quality:  Attentive and Sharing  Affect:  Excited  Cognitive:  Alert, Appropriate and Oriented  Insight:  Improving  Engagement in Therapy:  Engaged  Modes of Intervention:  Education, Support and Processing, Motivational  Interviewing  Ambrose MantleMareida Grossman-Orr, LCSW 04/26/2013, 11:58 AM

## 2013-04-27 MED ORDER — ONDANSETRON 4 MG PO TBDP
4.0000 mg | ORAL_TABLET | Freq: Four times a day (QID) | ORAL | Status: DC | PRN
  Administered 2013-04-27 – 2013-04-28 (×2): 4 mg via ORAL
  Filled 2013-04-27: qty 1

## 2013-04-27 MED ORDER — ACAMPROSATE CALCIUM 333 MG PO TBEC
666.0000 mg | DELAYED_RELEASE_TABLET | Freq: Three times a day (TID) | ORAL | Status: DC
  Administered 2013-04-27 – 2013-04-28 (×4): 666 mg via ORAL
  Filled 2013-04-27: qty 2
  Filled 2013-04-27: qty 84
  Filled 2013-04-27: qty 2
  Filled 2013-04-27 (×2): qty 84
  Filled 2013-04-27 (×4): qty 2

## 2013-04-27 NOTE — [facility] Group Notes (Signed)
[facility] Group Notes:  (Nursing/MHT/Case Management/Adjunct)  Date:  04/27/2013  Time:  11:27 AM  Type of Therapy:  Nurse Education  Participation Level:  Active  Participation Quality:  Appropriate  Affect:  Appropriate  Cognitive:  Appropriate  Insight:  Appropriate  Engagement in Group:  Engaged  Modes of Intervention:  Education  Summary of Progress/Problems:pt stated she does have people she can call when in distress.   Rodman KeyWebb, Lowen Mansouri [facility] 04/27/2013, 11:27 AM

## 2013-04-27 NOTE — Progress Notes (Signed)
[facility] MD Progress Note  04/27/2013 3:27 PM Janice Santos  MRN:  [id number] Subjective:  Janice Santos is not having the nausea and she slept better last night. She admits to persistent alcohol cravings. Even when she was abstinent for the 30 days she craved it every day. 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 She is planning to go back to work as soon as he gets out of here Diagnosis:   DSM5: Schizophrenia Disorders:  None Obsessive-Compulsive Disorders:  None Trauma-Stressor Disorders:  Posttraumatic Stress Disorder (309.81) Substance/Addictive Disorders:  Alcohol Related Disorder - Moderate (303.90) Depressive Disorders:  Major Depressive Disorder - Moderate (296.22) Total Time spent with patient: 30 minutes  Axis I: Substance Induced Mood Disorder  ADL's:  Intact  Sleep: Fair  Appetite:  Fair  Suicidal Ideation:  Plan:  denies Intent:  denies Means:  denies Homicidal Ideation:  Plan:  denies Intent:  denies Means:  denies AEB (as evidenced by):  Psychiatric Specialty Exam: Physical Exam  Review of Systems  Constitutional: Negative.   HENT: Negative.   Eyes: Negative.   Respiratory: Negative.   Cardiovascular: Negative.   Gastrointestinal: Negative.   Genitourinary: Negative.   Skin: Negative.   Psychiatric/Behavioral: Positive for depression. The patient is nervous/anxious.     Blood pressure 149/74, pulse 116, temperature 98 F (36.7 C), temperature source Oral, resp. rate 16, height 5' 7.72" (1.72 m), weight 92.987 kg (205 lb).Body mass index is 31.43 kg/(m^2).  General Appearance: Fairly Groomed  Patent attorney::  Fair  Speech:  Clear and Coherent  Volume:  Normal  Mood:  Anxious and worried  Affect:  anxious, worried  Thought Process:  Coherent and Goal Directed  Orientation:  Full (Time, Place, and Person)  Thought Content:  symptoms,  worries, concerns  Suicidal Thoughts:  No  Homicidal Thoughts:  No  Memory:  Immediate;   Fair Recent;   Fair Remote;   Fair  Judgement:  Fair  Insight:  Present  Psychomotor Activity:  Normal  Concentration:  Fair  Recall:  [employer] of Knowledge:Fair  Language: Fair  Akathisia:  No  Handed:    AIMS (if indicated):     Assets:  Desire for Improvement Housing Social Support Vocational/Educational  Sleep:  Number of Hours: 6.75   Musculoskeletal: Strength & Muscle Tone: within normal limits Gait & Station: normal Patient leans: N/Santos  Current Medications: Current Facility-Administered Medications  Medication Dose Route Frequency Provider Last Rate Last Dose  . acamprosate (CAMPRAL) tablet 666 mg  666 mg Oral TID Rachael Fee, MD   666 mg at 04/27/13 1230  . acetaminophen (TYLENOL) tablet 650 mg  650 mg Oral Q6H PRN Nanine Means, NP   650 mg at 04/25/13 2142  . alum & mag hydroxide-simeth (MAALOX/MYLANTA) 200-200-20 MG/5ML suspension 30 mL  30 mL Oral Q4H PRN Nanine Means, NP      . aspirin-acetaminophen-caffeine (EXCEDRIN MIGRAINE) per tablet 2 tablet  2 tablet Oral Q6H PRN Rachael Fee, MD   2 tablet at 04/27/13 [phone]  . chlordiazePOXIDE (LIBRIUM) capsule 25 mg  25 mg Oral Q6H PRN Nanine Means, NP   25 mg at 04/27/13 0829  . chlordiazePOXIDE (LIBRIUM) capsule 25 mg  25 mg Oral BH-qamhs Nanine Means, NP       Followed by  . [START ON 04/29/2013] chlordiazePOXIDE (LIBRIUM) capsule 25 mg  25 mg Oral Daily Nanine MeansJamison Lord, NP      . FLUoxetine (PROZAC) capsule 40 mg  40 mg Oral q morning - 10a Rachael FeeIrving Santos Lorilee Cafarella, MD   40 mg at 04/27/13 [phone]  . hydrOXYzine (ATARAX/VISTARIL) tablet 25 mg  25 mg Oral Q6H PRN Nanine MeansJamison Lord, NP   25 mg at 04/27/13 1155  . loperamide (IMODIUM) capsule 2-4 mg  2-4 mg Oral PRN Nanine MeansJamison Lord, NP   2 mg at 04/26/13 1843  . magnesium hydroxide (MILK OF MAGNESIA) suspension 30 mL  30 mL Oral Daily PRN Nanine MeansJamison Lord, NP      . metFORMIN (GLUCOPHAGE) tablet 500 mg  500  mg Oral TID Kristeen MansFran E Hobson, NP   500 mg at 04/27/13 1153  . mirtazapine (REMERON) tablet 30 mg  30 mg Oral QHS Rachael FeeIrving Santos Aidian Salomon, MD   30 mg at 04/26/13 2206  . multivitamin with minerals tablet 1 tablet  1 tablet Oral Daily Nanine MeansJamison Lord, NP   1 tablet at 04/27/13 0830  . ondansetron (ZOFRAN-ODT) disintegrating tablet 4 mg  4 mg Oral Q6H PRN Nanine MeansJamison Lord, NP   4 mg at 04/27/13 1157  . pantoprazole (PROTONIX) EC tablet 40 mg  40 mg Oral Daily Kristeen MansFran E Hobson, NP   40 mg at 04/27/13 0830  . sucralfate (CARAFATE) 1 GM/10ML suspension 1 g  1 g Oral TID WC & HS Rachael FeeIrving Santos Maikayla Beggs, MD   1 g at 04/27/13 1158  . thiamine (VITAMIN B-1) tablet 100 mg  100 mg Oral Daily Nanine MeansJamison Lord, NP   100 mg at 04/27/13 0830    Lab Results: No results found for this or any previous visit (from the past 48 hour(s)).  Physical Findings: AIMS: Facial and Oral Movements Muscles of Facial Expression: None, normal Lips and Perioral Area: None, normal Jaw: None, normal Tongue: None, normal,Extremity Movements Upper (arms, wrists, hands, fingers): None, normal Lower (legs, knees, ankles, toes): None, normal, Trunk Movements Neck, shoulders, hips: None, normal, Overall Severity Severity of abnormal movements (highest score from questions above): None, normal Incapacitation due to abnormal movements: None, normal Patient's awareness of abnormal movements (rate only patient's report): No Awareness, Dental Status Current problems with teeth and/or dentures?: No Does patient usually wear dentures?: No  CIWA:  CIWA-Ar Total: 13 COWS:     Treatment Plan Summary: Daily contact with patient to assess and evaluate symptoms and progress in treatment Medication management  Plan: Supportive approach/coping skills/relapse prevention           Reassess and address the co morbidities            Continue Prozac 40/Remeron 30           Will start Campral 333 mg two TID for alcohol cravings  Medical Decision Making Problem Points:  New  problem, with no additional work-up planned (3) and Review of psycho-social stressors (1) Data Points:  Review of medication regiment & side effects (2) Review of new medications or change in dosage (2)  I certify that inpatient services furnished can reasonably be expected to improve the patient's condition.   Janice Santos 04/27/2013, 3:27 PM

## 2013-04-27 NOTE — Progress Notes (Signed)
Patient did not attend the evening speaker AA meeting. Pt remained in bed during group time.   

## 2013-04-27 NOTE — Progress Notes (Signed)
Patient ID: Janice Santos, female   DOB: 05/28/1965, 48 y.o.   MRN: [id number] D)   Spent some time in the dayroom this evening, attended group was nibbling on some popcorn and drinking ginger ale.  Stated was feeling a little better tonight, less nausea, but still feeling anxiety, showing tremors, was diaphoretic.  Was medicated with librium, ciwa was 13.  Fluids have been encouraged this evening, and has been compliant with meds.   A)  Will continue to monitor for safety, w/d sx, continue POC R)  Safety maintained.

## 2013-04-27 NOTE — [facility] Group Notes (Signed)
[facility] Group Notes:  (Clinical Social Work)  04/27/2013  10:00-11:00AM  Summary of Progress/Problems:   The main focus of today's process group was to   identify the patient's current support system and decide on other supports that can be put in place.  The picture on workbook was used to discuss why additional supports are needed.  An emphasis was placed on using counselor, doctor, therapy groups, 12-step groups, and problem-specific support groups to expand supports.   There was also an extensive discussion about what constitutes a healthy support versus an unhealthy support.  The patient expressed full comprehension of the concepts presented, and agreed that there is a need to add more supports.  The patient stated the current supports in place are her daughter, granddaughter, AA and sponsor.  The unhealthy supports were some friends at home that she used to drink with, which is a big part of the reason she moved here.  She has not yet removed them from her phone, but has not answered any of their phone calls.  The supports that are to be added are, she hopes, a therapist.  She is asking for help with that.  Type of Therapy:  Process Group with Motivational Interviewing  Participation Level:  Active  Participation Quality:  Appropriate, Attentive, Sharing and Supportive  Affect:  Appropriate  Cognitive:  Alert, Appropriate and Oriented  Insight:  Engaged  Engagement in Therapy:  Engaged  Modes of Intervention:   Education, Support and Processing, Activity  Pilgrim's PrideMareida Grossman-Orr, LCSW 04/27/2013, 12:15pm

## 2013-04-27 NOTE — Progress Notes (Signed)
Pt appears in good spirits this am and states she feels much better. Pt rates her depression a 5/10 and her hopelessness a 1/10. She would like to start to use all her new coping skills and would like to start helping people like she has been helped. Pt does enjoy school and states this takes up a lot of her time. She contracts for safety and denies SI and HI. Pt has been attending groups.

## 2013-04-27 NOTE — [facility] Group Notes (Signed)
[facility] Group Notes:  (Nursing/MHT/Case Management/Adjunct)  Date:  04/27/2013  Time:  1:45 PM  Type of Therapy:  Nurse Education  Participation Level:  Did Not Attend  Participation Quality:  Inattentive  Affect:  Depressed  Cognitive:  Lacking  Insight:  None  Engagement in Group:  None  Modes of Intervention:  Discussion  Summary of Progress/Problems: Pt was meeting with the MD and did not attend group Rodman KeyWebb, Kayron Kalmar [facility] 04/27/2013, 1:45 PM

## 2013-04-27 NOTE — Progress Notes (Signed)
Adult Psychoeducational Group Note  Date:  04/27/2013 Time:  3:15PM  Group Topic/Focus:  Healthy Support Systems  Participation Level:  Active  Participation Quality:  Appropriate and Attentive  Affect:  Appropriate  Cognitive:  Appropriate  Insight: Appropriate  Engagement in Group:  Engaged  Modes of Intervention:  Discussion  Additional Comments:  Pt indicated that her children and her sponsor are here biggest supporters. Pt expressed that she could be a better support to herself by listening to good advice and attend as many meetings as possible.   Zacarias PontesSmith, Setsuko Robins R 04/27/2013, 6:11 PM

## 2013-04-28 DIAGNOSIS — F4310 Post-traumatic stress disorder, unspecified: Secondary | ICD-10-CM

## 2013-04-28 DIAGNOSIS — F329 Major depressive disorder, single episode, unspecified: Secondary | ICD-10-CM

## 2013-04-28 DIAGNOSIS — F1020 Alcohol dependence, uncomplicated: Secondary | ICD-10-CM

## 2013-04-28 MED ORDER — MIRTAZAPINE 30 MG PO TABS: 30.0000 mg | ORAL_TABLET | Freq: Every day | ORAL | Status: DC

## 2013-04-28 MED ORDER — ACAMPROSATE CALCIUM 333 MG PO TBEC: 666.0000 mg | DELAYED_RELEASE_TABLET | Freq: Three times a day (TID) | ORAL | Status: DC

## 2013-04-28 MED ORDER — METFORMIN HCL 500 MG PO TABS: 500.0000 mg | ORAL_TABLET | Freq: Three times a day (TID) | ORAL | Status: AC

## 2013-04-28 MED ORDER — FLUOXETINE HCL 40 MG PO CAPS: 40.0000 mg | ORAL_CAPSULE | Freq: Every morning | ORAL | Status: DC

## 2013-04-28 MED ORDER — OMEPRAZOLE 20 MG PO CPDR: 20.0000 mg | DELAYED_RELEASE_CAPSULE | Freq: Every day | ORAL | Status: DC

## 2013-04-28 NOTE — Progress Notes (Signed)
Adult Psychoeducational Group Note  Date:  04/28/2013 Time:  10:00 am  Group Topic/Focus:  Wellness Toolbox:   The focus of this group is to discuss various aspects of wellness, balancing those aspects and exploring ways to increase the ability to experience wellness.  Patients will create a wellness toolbox for use upon discharge.  Participation Level:  Active  Participation Quality:  Appropriate and Sharing  Affect:  Appropriate  Cognitive:  Appropriate  Insight: Appropriate  Engagement in Group:  Engaged  Modes of Intervention:  Discussion, Education and Socialization  Additional Comments:  Pt stated that by getting more sleep and by exercising that she can improve her overall health and wellness. Pt identified drinking and not having enough time for herself as barriers to her improving her physical and mental health.   Modena NunneryJohnson, Janice Santos 04/28/2013, 4:37 PM

## 2013-04-28 NOTE — Progress Notes (Signed)
Patient ID: Janice Santos, female   DOB: 02/05/1966, 48 y.o.   MRN: [id number] Writer reviewed pt discharge instructions with pt including medications, follow up care and crisis intervention. Pt acknowledged understanding of instructions and states [facility]. Pt denies SI/HI and AVH. Pt mood and affect are appropriate to the situation. Writer returned pt belongings from locker, and the pt is released into her own care.

## 2013-04-28 NOTE — [facility] Group Notes (Deleted)
[facility] LCSW Aftercare Discharge Planning Group Note   04/28/2013  8:45 AM  Participation Quality:  Did Not Attend - pt sleeping in her room  Janice Feigenbaum Horton, LCSW 04/28/2013 9:54 AM       

## 2013-04-28 NOTE — Progress Notes (Signed)
[facility] Discharge Plan :  Will you be returning to the same living situation after discharge: Yes,  returning home At discharge, do you have transportation home?:Yes,  family will pick pt up Do you have the ability to pay for your medications:Yes,  provided pt with prescriptions and pt verbalizes ability to afford meds  Release of information consent forms completed and in the chart;  Patient's signature needed at discharge.  Patient to Follow up at: Follow-up Information   Follow up with [facility] On 05/01/2013. (Appointment scheduled at 4:00 pm with [facility]iann [facility] for intake assessment. They will than schedule you for IOP, medication management and therapy)    Contact information:   [address] [place], [place] [address] Phone: [phone] Fax: [phone]      Patient denies SI/HI:   Yes,  denies SI/HI    Safety Planning and Suicide Prevention discussed:  Yes,  discussed with pt and pt's daughter.  See suicide prevention education note.   Janice MillerHorton, Janice Santos 04/28/2013, 10:24 AM
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
## 2013-04-28 NOTE — Tx Team (Signed)
Interdisciplinary Treatment Plan Update (Adult)  Date: 04/28/2013  Time Reviewed:  9:45 AM  Progress in Treatment: Attending groups: Yes Participating in groups:  Yes Taking medication as prescribed:  Yes Tolerating medication:  Yes Family/Significant othe contact made: Yes, with pt's daughter Patient understands diagnosis:  Yes Discussing patient identified problems/goals with staff:  Yes Medical problems stabilized or resolved:  Yes Denies suicidal/homicidal ideation: Yes Issues/concerns per patient self-inventory:  Yes Other:  New problem(s) identified: N/A  Discharge Plan or Barriers: Pt will follow up at [facility] for CDIOP, medication management and therapy.    Reason for Continuation of Hospitalization: Stable to d/c today  Comments: N/A  Estimated length of stay: D/C today  For review of initial/current patient goals, please see plan of care.  Attendees: Patient:     Family:     Physician:  Dr. Dub MikesLugo 04/28/2013 10:21 AM   Nursing:    04/28/2013 10:21 AM   Clinical Social Worker:  Reyes Ivanhelsea Horton, LCSW 04/28/2013 10:21 AM   Other:  04/28/2013 10:21 AM   Other:  Trula SladeHeather Smart, LCSWA 04/28/2013 10:21 AM   Other:     Other:     Other:    Other:    Other:    Other:    Other:    Other:     Scribe for Treatment Team:   Carmina MillerHorton, Baran Kuhrt Nicole, 04/28/2013 , 10:21 AM

## 2013-04-28 NOTE — [facility] Group Notes (Signed)
[facility]   04/28/2013 11:26 AM  Participation Quality:  Appropriate   Mood/Affect:  Appropriate  Depression Rating:  0  Anxiety Rating:  4  Thoughts of Suicide:  No Will you contract for safety?   NA  Current AVH:  none  Plan for Discharge/Comments:  Pt reports that she is returning home at d/c and plans to return to work (note needed). Pt will follow up [facility] for assessment ([facility] IOP and med management).   Transportation Means: daughter   Supports: daughter; some family support.   Smart, American FinancialHeather LCSWA

## 2013-04-28 NOTE — Progress Notes (Signed)
Patient ID: Janice Santos, female   DOB: 06/23/1965, 48 y.o.   MRN: [id number] D)   Has been resting quietly tonight, eyes closed, resp reg, unlabored, no c/o's voiced. A)  Will continue to monitor for safety, continue POC R)  Safety maintained.

## 2013-04-28 NOTE — [facility] Suicide Risk Assessment (Signed)
Suicide Risk Assessment  Discharge Assessment     Demographic Factors:  NA  Total Time spent with patient: 45 minutes  Psychiatric Specialty Exam:     Blood pressure 121/86, pulse 111, temperature 98 F (36.7 C), temperature source Oral, resp. rate 16, height 5' 7.72" (1.72 m), weight 92.987 kg (205 lb).Body mass index is 31.43 kg/(m^2).  General Appearance: Fairly Groomed  Patent attorneyye Contact::  Fair  Speech:  Clear and Coherent  Volume:  Decreased  Mood:  Anxious  Affect:  worried  Thought Process:  Coherent and Goal Directed  Orientation:  Full (Time, Place, and Person)  Thought Content:  GI symptoms, relapse prevention plan  Suicidal Thoughts:  No  Homicidal Thoughts:  No  Memory:  Immediate;   Fair Recent;   Fair Remote;   Fair  Judgement:  Fair  Insight:  Present  Psychomotor Activity:  Normal  Concentration:  Fair  Recall:  FiservFair  Fund of Knowledge:Fair  Language: Fair  Akathisia:  No  Handed:    AIMS (if indicated):     Assets:  Desire for Improvement Housing Vocational/Educational  Sleep:  Number of Hours: 5    Musculoskeletal: Strength & Muscle Tone: within normal limits Gait & Station: normal Patient leans: N/A   Mental Status Per Nursing Assessment::   On Admission:  Self-harm thoughts;Self-harm behaviors  Current Mental Status by Physician: In full contact with reality. There are no active S/S of withdrawal.    Loss Factors: NA  Historical Factors: NA  Risk Reduction Factors:   Sense of responsibility to family, Employed, Living with another person, especially a relative and Positive social support  Continued Clinical Symptoms:  Depression:   Comorbid alcohol abuse/dependence Alcohol/Substance Abuse/Dependencies  Cognitive Features That Contribute To Risk:  Polarized thinking Thought constriction (tunnel vision)    Suicide Risk:  Minimal: No identifiable suicidal ideation.  Patients presenting with no risk factors but with morbid  ruminations; may be classified as minimal risk based on the severity of the depressive symptoms  Discharge Diagnoses:   AXIS I:  Alcohol Dependence, MDD, PTSD AXIS II:  Deferred AXIS III:   Past Medical History  Diagnosis Date  . Alcoholism   . Diabetes mellitus without complication    AXIS IV:  other psychosocial or environmental problems AXIS V:  61-70 mild symptoms  Plan Of Care/Follow-up recommendations:  Activity:  as tolerated Diet:  regular Follow up outpatient basis Is patient on multiple antipsychotic therapies at discharge:  No   Has Patient had three or more failed trials of antipsychotic monotherapy by history:  No  Recommended Plan for Multiple Antipsychotic Therapies: NA    Aubrynn Katona A 04/28/2013, 2:00 PM

## 2013-04-29 NOTE — Consult Note (Signed)
Face to face evaluation and I agree with this note 

## 2013-04-30 NOTE — Progress Notes (Signed)
Patient Discharge Instructions:  After Visit Summary (AVS):   Faxed to:  04/30/13 Discharge Summary Note:   Faxed to:  04/30/13 Psychiatric Admission Assessment Note:   Faxed to:  04/30/13 Suicide Risk Assessment - Discharge Assessment:   Faxed to:  04/30/13 Faxed/Sent to the Next Level Care provider:  04/30/13 Faxed to the [facility] @ [phone]  Jerelene ReddenSheena E La Paz, 04/30/2013, 4:13 PM

## 2013-06-02 ENCOUNTER — Emergency Department (HOSPITAL_COMMUNITY): Payer: BC Managed Care – PPO

## 2013-06-02 ENCOUNTER — Emergency Department (HOSPITAL_COMMUNITY)
Admission: EM | Admit: 2013-06-02 | Discharge: 2013-06-03 | Disposition: A | Discharge status: Home / Self Care | Payer: BC Managed Care – PPO | Attending: Emergency Medicine | Admitting: Emergency Medicine

## 2013-06-02 ENCOUNTER — Encounter (HOSPITAL_COMMUNITY): Payer: Self-pay | Admitting: Emergency Medicine

## 2013-06-02 DIAGNOSIS — K625 Hemorrhage of anus and rectum: Secondary | ICD-10-CM

## 2013-06-02 DIAGNOSIS — R0602 Shortness of breath: Secondary | ICD-10-CM | POA: Insufficient documentation

## 2013-06-02 DIAGNOSIS — Z87891 Personal history of nicotine dependence: Secondary | ICD-10-CM | POA: Insufficient documentation

## 2013-06-02 DIAGNOSIS — R109 Unspecified abdominal pain: Secondary | ICD-10-CM

## 2013-06-02 DIAGNOSIS — E119 Type 2 diabetes mellitus without complications: Secondary | ICD-10-CM | POA: Insufficient documentation

## 2013-06-02 DIAGNOSIS — Z79899 Other long term (current) drug therapy: Secondary | ICD-10-CM | POA: Insufficient documentation

## 2013-06-02 DIAGNOSIS — R079 Chest pain, unspecified: Secondary | ICD-10-CM | POA: Insufficient documentation

## 2013-06-02 DIAGNOSIS — R112 Nausea with vomiting, unspecified: Secondary | ICD-10-CM | POA: Insufficient documentation

## 2013-06-02 DIAGNOSIS — Z9884 Bariatric surgery status: Secondary | ICD-10-CM | POA: Insufficient documentation

## 2013-06-02 LAB — COMPREHENSIVE METABOLIC PANEL
ALBUMIN: 3.9 g/dL (ref 3.5–5.2)
ALT: 87 U/L — ABNORMAL HIGH (ref 0–35)
AST: 57 U/L — ABNORMAL HIGH (ref 0–37)
Alkaline Phosphatase: 127 U/L — ABNORMAL HIGH (ref 39–117)
BUN: 19 mg/dL (ref 6–23)
CO2: 22 mEq/L (ref 19–32)
Calcium: 9.6 mg/dL (ref 8.4–10.5)
Chloride: 104 mEq/L (ref 96–112)
Creatinine, Ser: 0.74 mg/dL (ref 0.50–1.10)
GFR calc Af Amer: >90 mL/min (ref >90)
GFR calc non Af Amer: >90 mL/min (ref >90)
Glucose, Bld: 112 mg/dL — ABNORMAL HIGH (ref 70–99)
Potassium: 4.4 mEq/L (ref 3.7–5.3)
Sodium: 140 mEq/L (ref 137–147)
TOTAL PROTEIN: 7.6 g/dL (ref 6.0–8.3)

## 2013-06-02 LAB — TROPONIN I

## 2013-06-02 LAB — CBC
HCT: 41.3 % (ref 36.0–46.0)
Hemoglobin: 14.3 g/dL (ref 12.0–15.0)
MCH: 30.6 pg (ref 26.0–34.0)
MCHC: 34.6 g/dL (ref 30.0–36.0)
MCV: 88.2 fL (ref 78.0–100.0)
Platelets: 294 10*3/uL (ref 150–400)
RBC: 4.68 MIL/uL (ref 3.87–5.11)
RDW: 12.6 % (ref 11.5–15.5)
WBC: 8.6 10*3/uL (ref 4.0–10.5)

## 2013-06-02 LAB — I-STAT TROPONIN, ED: TROPONIN I, POC: 0 ng/mL (ref 0.00–0.08)

## 2013-06-02 LAB — URINALYSIS, ROUTINE W REFLEX MICROSCOPIC
Bilirubin Urine: NEGATIVE
Glucose, UA: NEGATIVE mg/dL
HGB URINE DIPSTICK: NEGATIVE
Ketones, ur: NEGATIVE mg/dL
Leukocytes, UA: NEGATIVE
NITRITE: NEGATIVE
PH: 6 (ref 5.0–8.0)
Protein, ur: NEGATIVE mg/dL
Specific Gravity, Urine: 1.035 — ABNORMAL HIGH (ref 1.005–1.030)
Urobilinogen, UA: 0.2 mg/dL (ref 0.0–1.0)

## 2013-06-02 LAB — TYPE AND SCREEN
ABO/RH(D): O POS
Antibody Screen: NEGATIVE

## 2013-06-02 LAB — LIPASE, BLOOD: Lipase: 22 U/L (ref 11–59)

## 2013-06-02 LAB — ABO/RH: ABO/RH(D): O POS

## 2013-06-02 LAB — POC OCCULT BLOOD, ED: FECAL OCCULT BLD: NEGATIVE

## 2013-06-02 LAB — PROTIME-INR
INR: 0.92 (ref 0.00–1.49)
Prothrombin Time: 12.2 seconds (ref 11.6–15.2)

## 2013-06-02 LAB — APTT: aPTT: 29 seconds (ref 24–37)

## 2013-06-02 MED ORDER — FENTANYL CITRATE 0.05 MG/ML IJ SOLN
50.0000 ug | Freq: Once | INTRAMUSCULAR | Status: AC
  Administered 2013-06-02: 50 ug via INTRAVENOUS
  Filled 2013-06-02: qty 2

## 2013-06-02 MED ORDER — ONDANSETRON HCL 4 MG/2ML IJ SOLN
4.0000 mg | Freq: Once | INTRAMUSCULAR | Status: AC
  Administered 2013-06-02: 4 mg via INTRAVENOUS
  Filled 2013-06-02: qty 2

## 2013-06-02 MED ORDER — IOHEXOL 300 MG/ML  SOLN
100.0000 mL | Freq: Once | INTRAMUSCULAR | Status: AC | PRN
  Administered 2013-06-02: 100 mL via INTRAVENOUS

## 2013-06-02 MED ORDER — KETOROLAC TROMETHAMINE 30 MG/ML IJ SOLN
30.0000 mg | Freq: Once | INTRAMUSCULAR | Status: AC
  Administered 2013-06-02: 30 mg via INTRAVENOUS
  Filled 2013-06-02: qty 1

## 2013-06-02 MED ORDER — OMEPRAZOLE 20 MG PO CPDR: 20.0000 mg | DELAYED_RELEASE_CAPSULE | Freq: Every day | ORAL | Status: DC

## 2013-06-02 MED ORDER — MORPHINE SULFATE 4 MG/ML IJ SOLN: 4.0000 mg | Freq: Once | INTRAMUSCULAR | Status: DC

## 2013-06-02 MED ORDER — SODIUM CHLORIDE 0.9 % IV BOLUS (SEPSIS)
1000.0000 mL | Freq: Once | INTRAVENOUS | Status: AC
  Administered 2013-06-02: 1000 mL via INTRAVENOUS

## 2013-06-02 MED ORDER — IOHEXOL 300 MG/ML  SOLN
25.0000 mL | Freq: Once | INTRAMUSCULAR | Status: AC | PRN
  Administered 2013-06-02: 25 mL via ORAL

## 2013-06-02 NOTE — ED Provider Notes (Signed)
TIME SEEN: 7:00 PM  CHIEF COMPLAINT: Chest pain, shortness of breath, abdominal pain and swelling, rectal bleeding  HPI: Patient is a 48 y.o. F  with history of diabetes, prior tobacco abuse, gastric bypass, alcohol abuse has been sober for the past 32 days who presents emergency department with bright red blood per rectum and dark stools that started on Friday, 3 days ago. She noticed that when she had a bowel movement there was blood on the toilet paper and in the toilet bowl. She believes it may have been mixed in her stool as well. She also noticed the next day she was having a dull epigastric/central chest pain and shortness of breath is worse with movement. It is not exertional or pleuritic. She is also had nausea and vomiting but no hematemesis. She has noticed right-sided abdominal pain with abdominal swelling. She denies a history of esophageal varices or ascites in the past. She has never had a paracentesis. No history of cardiac catheterization or stress test. No recent fevers or cough. She is not on iron supplements and has not been taking Pepto-Bismol. She has had an endoscopy many years ago before her gastric bypass. No history of colonoscopy. She is on anticoagulation. No history of blood transfusion.  ROS: See HPI Constitutional: no fever  Eyes: no drainage  ENT: no runny nose   Cardiovascular:  chest pain  Resp:  SOB  GI:  vomiting GU: no dysuria Integumentary: no rash  Allergy: no hives  Musculoskeletal: no leg swelling  Neurological: no slurred speech ROS otherwise negative  PAST MEDICAL HISTORY/PAST SURGICAL HISTORY:  Past Medical History  Diagnosis Date  . Alcoholism   . Diabetes mellitus without complication     MEDICATIONS:  Prior to Admission medications   Medication Sig Start Date End Date Taking? Authorizing Provider  Cinnamon 500 MG capsule Take 500 mg by mouth daily. With meals   Yes Historical Provider, MD  clonazePAM (KLONOPIN) 1 MG tablet Take 1 mg by  mouth at bedtime.   Yes Historical Provider, MD  FLUoxetine (PROZAC) 40 MG capsule Take 40 mg by mouth daily. 04/28/13  Yes Fransisca KaufmannLaura Davis, NP  Multiple Vitamin (MULTIVITAMIN WITH MINERALS) TABS tablet Take 1 tablet by mouth daily.   Yes Historical Provider, MD  metFORMIN (GLUCOPHAGE) 500 MG tablet Take 1 tablet (500 mg total) by mouth 3 (three) times daily. 04/28/13   Fransisca KaufmannLaura Davis, NP    ALLERGIES:  Allergies  Allergen Reactions  . Morphine And Related Hives    Itching   . Sulfur Hives    Itching     SOCIAL HISTORY:  History  Substance Use Topics  . Smoking status: Former Games developermoker  . Smokeless tobacco: Not on file  . Alcohol Use: Not on file    FAMILY HISTORY: History reviewed. No pertinent family history.  EXAM: BP 130/88  Pulse 81  Temp(Src) 97.9 F (36.6 C) (Oral)  Resp 17  SpO2 100% CONSTITUTIONAL: Alert and oriented and responds appropriately to questions. Well-appearing; well-nourished HEAD: Normocephalic EYES: Conjunctivae clear, PERRL; no conjunctival pallor ENT: normal nose; no rhinorrhea; moist mucous membranes; pharynx without lesions noted NECK: Supple, no meningismus, no LAD  CARD: RRR; S1 and S2 appreciated; no murmurs, no clicks, no rubs, no gallops RESP: Normal chest excursion without splinting or tachypnea; breath sounds clear and equal bilaterally; no wheezes, no rhonchi, no rales,  ABD/GI: Normal bowel sounds; abdomen is mildly distended but nontympanitic and there is a fluid wave, patient has a large vertical surgical scar  that is well-healed in the center of her abdomen, tender to palpation in the right abdomen with negative Murphy sign, no tenderness at McBurney's point, no guarding or rebound, no peritoneal signs  RECTAL:  No gross blood the patient does have stool exam, no hemorrhoids, normal rectal tone, nontender  BACK:  The back appears normal and is non-tender to palpation, there is no CVA tenderness EXT: Normal ROM in all joints; non-tender to  palpation; no edema; normal capillary refill; no cyanosis    SKIN: Normal color for age and race; warm NEURO: Moves all extremities equally PSYCH: The patient's mood and manner are appropriate. Grooming and personal hygiene are appropriate.  MEDICAL DECISION MAKING:  Patient here with multiple symptoms. She is a recovering alcoholic denies a history of prior GI bleed or esophageal varices in the past. Concern for upper GI bleed. Will obtain abdominal labs, troponin and EKG. We'll give IV fluids. She is currently hemodynamically stable. We'll obtain abdominal x-ray as well as a CT scan of her abdomen given her right-sided abdominal pain.   ED PROGRESS: Patient's Hemoccult is negative for blood. Her hemoglobin is 14.3. Troponin negative. She does have mild elevation of her LFTs consistent with history of alcohol abuse. Her coags are normal. CT scan pending.  11:43 PM  Patient CT scan shows no acute abnormality. The appendix is not visualized the patient is not having any right lower quadrant pain on exam currently and has no leukocytosis or fever. Patient is not sure if her appendix was removed with her gastric bypass. There is no sign of bowel inflammation on CT scan. No obstruction or ascites. She has not had a bloody bowel movement in the emergency department. Have repeated patient's troponin and her second troponin is also negative.  Given her chest pain is atypical and she has no new ischemic changes on her EKG this had 2 negative cardiac enzymes, I feel the patient is safe to be discharged home. Her epigastric pain/chest pain may be due to acid reflux, gastritis. We'll start patient on PPI. We'll give PCP followup information. Have given strict return precautions. Patient and her daughter at bedside are comfortable with this plan. She states that she would like to be discharged home.    EKG Interpretation  Date/Time:  Monday June 02 2013 17:41:45 EDT Ventricular Rate:  85 PR Interval:  155 QRS  Duration: 93 QT Interval:  395 QTC Calculation: 470 R Axis:   -11 Text Interpretation:  Sinus rhythm Borderline T abnormalities, anterior leads Baseline wander in lead(s) III aVL No significant change since last tracing Confirmed by Stellar Gensel,  DO, Nikisha Fleece [phone]) on 06/02/2013 7:37:14 PM        Layla Maw Stellar Gensel, DO 06/02/13 [id number]

## 2013-06-02 NOTE — Discharge Instructions (Signed)
Abdominal Pain, Women °Abdominal (stomach, pelvic, or belly) pain can be caused by many things. It is important to tell your doctor: °· The location of the pain. °· Does it come and go or is it present all the time? °· Are there things that start the pain (eating certain foods, exercise)? °· Are there other symptoms associated with the pain (fever, nausea, vomiting, diarrhea)? °All of this is helpful to know when trying to find the cause of the pain. °CAUSES  °· Stomach: virus or bacteria infection, or ulcer. °· Intestine: appendicitis (inflamed appendix), regional ileitis (Crohn's disease), ulcerative colitis (inflamed colon), irritable bowel syndrome, diverticulitis (inflamed diverticulum of the colon), or cancer of the stomach or intestine. °· Gallbladder disease or stones in the gallbladder. °· Kidney disease, kidney stones, or infection. °· Pancreas infection or cancer. °· Fibromyalgia (pain disorder). °· Diseases of the female organs: °· Uterus: fibroid (non-cancerous) tumors or infection. °· Fallopian tubes: infection or tubal pregnancy. °· Ovary: cysts or tumors. °· Pelvic adhesions (scar tissue). °· Endometriosis (uterus lining tissue growing in the pelvis and on the pelvic organs). °· Pelvic congestion syndrome (female organs filling up with blood just before the menstrual period). °· Pain with the menstrual period. °· Pain with ovulation (producing an egg). °· Pain with an IUD (intrauterine device, birth control) in the uterus. °· Cancer of the female organs. °· Functional pain (pain not caused by a disease, may improve without treatment). °· Psychological pain. °· Depression. °DIAGNOSIS  °Your doctor will decide the seriousness of your pain by doing an examination. °· Blood tests. °· X-rays. °· Ultrasound. °· CT scan (computed tomography, special type of X-ray). °· MRI (magnetic resonance imaging). °· Cultures, for infection. °· Barium enema (dye inserted in the large intestine, to better view it with  X-rays). °· Colonoscopy (looking in intestine with a lighted tube). °· Laparoscopy (minor surgery, looking in abdomen with a lighted tube). °· Major abdominal exploratory surgery (looking in abdomen with a large incision). °TREATMENT  °The treatment will depend on the cause of the pain.  °· Many cases can be observed and treated at home. °· Over-the-counter medicines recommended by your caregiver. °· Prescription medicine. °· Antibiotics, for infection. °· Birth control pills, for painful periods or for ovulation pain. °· Hormone treatment, for endometriosis. °· Nerve blocking injections. °· Physical therapy. °· Antidepressants. °· Counseling with a psychologist or psychiatrist. °· Minor or major surgery. °HOME CARE INSTRUCTIONS  °· Do not take laxatives, unless directed by your caregiver. °· Take over-the-counter pain medicine only if ordered by your caregiver. Do not take aspirin because it can cause an upset stomach or bleeding. °· Try a clear liquid diet (broth or water) as ordered by your caregiver. Slowly move to a bland diet, as tolerated, if the pain is related to the stomach or intestine. °· Have a thermometer and take your temperature several times a day, and record it. °· Bed rest and sleep, if it helps the pain. °· Avoid sexual intercourse, if it causes pain. °· Avoid stressful situations. °· Keep your follow-up appointments and tests, as your caregiver orders. °· If the pain does not go away with medicine or surgery, you may try: °· Acupuncture. °· Relaxation exercises (yoga, meditation). °· Group therapy. °· Counseling. °SEEK MEDICAL CARE IF:  °· You notice certain foods cause stomach pain. °· Your home care treatment is not helping your pain. °· You need stronger pain medicine. °· You want your IUD removed. °· You feel faint or   lightheaded.  You develop nausea and vomiting.  You develop a rash.  You are having side effects or an allergy to your medicine. SEEK IMMEDIATE MEDICAL CARE IF:   Your  pain does not go away or gets worse.  You have a fever.  Your pain is felt only in portions of the abdomen. The right side could possibly be appendicitis. The left lower portion of the abdomen could be colitis or diverticulitis.  You are passing blood in your stools (bright red or black tarry stools, with or without vomiting).  You have blood in your urine.  You develop chills, with or without a fever.  You pass out. MAKE SURE YOU:   Understand these instructions.  Will watch your condition.  Will get help right away if you are not doing well or get worse. Document Released: 12/18/2006 Document Revised: 05/15/2011 Document Reviewed: 01/07/2009 [facility] Patient Information 2014 [place], [place].  Chest Pain (Nonspecific) It is often hard to give a specific diagnosis for the cause of chest pain. There is always a chance that your pain could be related to something serious, such as a heart attack or a blood clot in the lungs. You need to follow up with your caregiver for further evaluation. CAUSES   Heartburn.  Pneumonia or bronchitis.  Anxiety or stress.  Inflammation around your heart (pericarditis) or lung (pleuritis or pleurisy).  A blood clot in the lung.  A collapsed lung (pneumothorax). It can develop suddenly on its own (spontaneous pneumothorax) or from injury (trauma) to the chest.  Shingles infection (herpes zoster virus). The chest wall is composed of bones, muscles, and cartilage. Any of these can be the source of the pain.  The bones can be bruised by injury.  The muscles or cartilage can be strained by coughing or overwork.  The cartilage can be affected by inflammation and become sore (costochondritis). DIAGNOSIS  Lab tests or other studies, such as X-rays, electrocardiography, stress testing, or cardiac imaging, may be needed to find the cause of your pain.  TREATMENT   Treatment depends on what may be causing your chest pain. Treatment may  include:  Acid blockers for heartburn.  Anti-inflammatory medicine.  Pain medicine for inflammatory conditions.  Antibiotics if an infection is present.  You may be advised to change lifestyle habits. This includes stopping smoking and avoiding alcohol, caffeine, and chocolate.  You may be advised to keep your head raised (elevated) when sleeping. This reduces the chance of acid going backward from your stomach into your esophagus.  Most of the time, nonspecific chest pain will improve within 2 to 3 days with rest and mild pain medicine. HOME CARE INSTRUCTIONS   If antibiotics were prescribed, take your antibiotics as directed. Finish them even if you start to feel better.  For the next few days, avoid physical activities that bring on chest pain. Continue physical activities as directed.  Do not smoke.  Avoid drinking alcohol.  Only take over-the-counter or prescription medicine for pain, discomfort, or fever as directed by your caregiver.  Follow your caregiver's suggestions for further testing if your chest pain does not go away.  Keep any follow-up appointments you made. If you do not go to an appointment, you could develop lasting (chronic) problems with pain. If there is any problem keeping an appointment, you must call to reschedule. SEEK MEDICAL CARE IF:   You think you are having problems from the medicine you are taking. Read your medicine instructions carefully.  Your chest pain  does not go away, even after treatment.  You develop a rash with blisters on your chest. SEEK IMMEDIATE MEDICAL CARE IF:   You have increased chest pain or pain that spreads to your arm, neck, jaw, back, or abdomen.  You develop shortness of breath, an increasing cough, or you are coughing up blood.  You have severe back or abdominal pain, feel nauseous, or vomit.  You develop severe weakness, fainting, or chills.  You have a fever. THIS IS AN EMERGENCY. Do not wait to see if the  pain will go away. Get medical help at once. Call your local emergency services (911 in [place] Do not drive yourself to the hospital. MAKE SURE YOU:   Understand these instructions.  Will watch your condition.  Will get help right away if you are not doing well or get worse. Document Released: 11/30/2004 Document Revised: 05/15/2011 Document Reviewed: 09/26/2007 [facility] Patient Information 2014 [place], [place]. Gastroesophageal Reflux Disease, Adult Gastroesophageal reflux disease (GERD) happens when acid from your stomach flows up into the esophagus. When acid comes in contact with the esophagus, the acid causes soreness (inflammation) in the esophagus. Over time, GERD may create small holes (ulcers) in the lining of the esophagus. CAUSES   Increased body weight. This puts pressure on the stomach, making acid rise from the stomach into the esophagus.  Smoking. This increases acid production in the stomach.  Drinking alcohol. This causes decreased pressure in the lower esophageal sphincter (valve or ring of muscle between the esophagus and stomach), allowing acid from the stomach into the esophagus.  Late evening meals and a full stomach. This increases pressure and acid production in the stomach.  A malformed lower esophageal sphincter. Sometimes, no cause is found. SYMPTOMS   Burning pain in the lower part of the mid-chest behind the breastbone and in the mid-stomach area. This may occur twice a week or more often.  Trouble swallowing.  Sore throat.  Dry cough.  Asthma-like symptoms including chest tightness, shortness of breath, or wheezing. DIAGNOSIS  Your caregiver may be able to diagnose GERD based on your symptoms. In some cases, X-rays and other tests may be done to check for complications or to check the condition of your stomach and esophagus. TREATMENT  Your caregiver may recommend over-the-counter or prescription medicines to help decrease acid production. Ask your  caregiver before starting or adding any new medicines.  HOME CARE INSTRUCTIONS   Change the factors that you can control. Ask your caregiver for guidance concerning weight loss, quitting smoking, and alcohol consumption.  Avoid foods and drinks that make your symptoms worse, such as:  Caffeine or alcoholic drinks.  Chocolate.  Peppermint or mint flavorings.  Garlic and onions.  Spicy foods.  Citrus fruits, such as oranges, lemons, or limes.  Tomato-based foods such as sauce, chili, salsa, and pizza.  Fried and fatty foods.  Avoid lying down for the 3 hours prior to your bedtime or prior to taking a nap.  Eat small, frequent meals instead of large meals.  Wear loose-fitting clothing. Do not wear anything tight around your waist that causes pressure on your stomach.  Raise the head of your bed 6 to 8 inches with wood blocks to help you sleep. Extra pillows will not help.  Only take over-the-counter or prescription medicines for pain, discomfort, or fever as directed by your caregiver.  Do not take aspirin, ibuprofen, or other nonsteroidal anti-inflammatory drugs (NSAIDs). SEEK IMMEDIATE MEDICAL CARE IF:   You have pain in your  arms, neck, jaw, teeth, or back.  Your pain increases or changes in intensity or duration.  You develop nausea, vomiting, or sweating (diaphoresis).  You develop shortness of breath, or you faint.  Your vomit is green, yellow, black, or looks like coffee grounds or blood.  Your stool is red, bloody, or black. These symptoms could be signs of other problems, such as heart disease, gastric bleeding, or esophageal bleeding. MAKE SURE YOU:   Understand these instructions.  Will watch your condition.  Will get help right away if you are not doing well or get worse. Document Released: 11/30/2004 Document Revised: 05/15/2011 Document Reviewed: 09/09/2010 [facility] Patient Information 2014 [place], [place].

## 2013-06-02 NOTE — ED Notes (Signed)
Patient transported to X-ray 

## 2013-06-02 NOTE — ED Notes (Signed)
Pt states that she started having abdominal swelling, rectal bleeding, chest pain and sob on Friday. States she recently stopped drinking 30 days ago as she is a recovering alcoholic. Alert and oriented.

## 2013-06-05 ENCOUNTER — Emergency Department (HOSPITAL_COMMUNITY)
Admission: EM | Admit: 2013-06-05 | Discharge: 2013-06-06 | Discharge status: Against Medical Advice | Payer: BC Managed Care – PPO | Source: Home / Self Care

## 2013-06-05 ENCOUNTER — Emergency Department (HOSPITAL_COMMUNITY)
Admission: EM | Admit: 2013-06-05 | Discharge: 2013-06-05 | Disposition: A | Discharge status: Home / Self Care | Payer: BC Managed Care – PPO | Attending: Emergency Medicine | Admitting: Emergency Medicine

## 2013-06-05 ENCOUNTER — Encounter (HOSPITAL_COMMUNITY): Payer: Self-pay | Admitting: Emergency Medicine

## 2013-06-05 ENCOUNTER — Emergency Department (HOSPITAL_COMMUNITY): Payer: BC Managed Care – PPO

## 2013-06-05 DIAGNOSIS — R109 Unspecified abdominal pain: Secondary | ICD-10-CM

## 2013-06-05 DIAGNOSIS — E119 Type 2 diabetes mellitus without complications: Secondary | ICD-10-CM | POA: Insufficient documentation

## 2013-06-05 DIAGNOSIS — K625 Hemorrhage of anus and rectum: Secondary | ICD-10-CM | POA: Insufficient documentation

## 2013-06-05 DIAGNOSIS — R1011 Right upper quadrant pain: Secondary | ICD-10-CM | POA: Insufficient documentation

## 2013-06-05 DIAGNOSIS — Z90710 Acquired absence of both cervix and uterus: Secondary | ICD-10-CM | POA: Insufficient documentation

## 2013-06-05 DIAGNOSIS — R079 Chest pain, unspecified: Secondary | ICD-10-CM | POA: Insufficient documentation

## 2013-06-05 DIAGNOSIS — R1110 Vomiting, unspecified: Secondary | ICD-10-CM | POA: Insufficient documentation

## 2013-06-05 DIAGNOSIS — Z3202 Encounter for pregnancy test, result negative: Secondary | ICD-10-CM | POA: Insufficient documentation

## 2013-06-05 DIAGNOSIS — Z79899 Other long term (current) drug therapy: Secondary | ICD-10-CM | POA: Insufficient documentation

## 2013-06-05 DIAGNOSIS — R0602 Shortness of breath: Secondary | ICD-10-CM | POA: Insufficient documentation

## 2013-06-05 DIAGNOSIS — R0789 Other chest pain: Secondary | ICD-10-CM | POA: Insufficient documentation

## 2013-06-05 DIAGNOSIS — Z9889 Other specified postprocedural states: Secondary | ICD-10-CM | POA: Insufficient documentation

## 2013-06-05 LAB — COMPREHENSIVE METABOLIC PANEL
ALBUMIN: 3.9 g/dL (ref 3.5–5.2)
ALK PHOS: 110 U/L (ref 39–117)
ALT: 52 U/L — ABNORMAL HIGH (ref 0–35)
ALT: 53 U/L — AB (ref 0–35)
AST: 41 U/L — AB (ref 0–37)
AST: 39 U/L — AB (ref 0–37)
Albumin: 3.9 g/dL (ref 3.5–5.2)
Alkaline Phosphatase: 112 U/L (ref 39–117)
BUN: 23 mg/dL (ref 6–23)
BUN: 22 mg/dL (ref 6–23)
CHLORIDE: 104 meq/L (ref 96–112)
CO2: 18 mEq/L — ABNORMAL LOW (ref 19–32)
CO2: 21 meq/L (ref 19–32)
Calcium: 9 mg/dL (ref 8.4–10.5)
Calcium: 9.4 mg/dL (ref 8.4–10.5)
Chloride: 103 mEq/L (ref 96–112)
Creatinine, Ser: 0.78 mg/dL (ref 0.50–1.10)
Creatinine, Ser: 0.8 mg/dL (ref 0.50–1.10)
GFR calc Af Amer: >90 mL/min (ref >90)
GFR calc Af Amer: >90 mL/min (ref >90)
GFR calc non Af Amer: >90 mL/min (ref >90)
GFR calc non Af Amer: 86 mL/min — ABNORMAL LOW (ref >90)
GLUCOSE: 139 mg/dL — AB (ref 70–99)
Glucose, Bld: 155 mg/dL — ABNORMAL HIGH (ref 70–99)
POTASSIUM: 4.2 meq/L (ref 3.7–5.3)
Potassium: 4.3 mEq/L (ref 3.7–5.3)
SODIUM: 139 meq/L (ref 137–147)
Sodium: 139 mEq/L (ref 137–147)
Total Bilirubin: <0.2 mg/dL — ABNORMAL LOW (ref 0.3–1.2)
Total Protein: 7.5 g/dL (ref 6.0–8.3)
Total Protein: 7.8 g/dL (ref 6.0–8.3)

## 2013-06-05 LAB — CBC WITH DIFFERENTIAL/PLATELET
BASOS ABS: 0 10*3/uL (ref 0.0–0.1)
BASOS ABS: 0 10*3/uL (ref 0.0–0.1)
BASOS PCT: 0 % (ref 0–1)
Basophils Relative: 0 % (ref 0–1)
Eosinophils Absolute: 0.2 10*3/uL (ref 0.0–0.7)
Eosinophils Absolute: 0.2 10*3/uL (ref 0.0–0.7)
Eosinophils Relative: 2 % (ref 0–5)
Eosinophils Relative: 2 % (ref 0–5)
HCT: 41.9 % (ref 36.0–46.0)
HEMATOCRIT: 40.7 % (ref 36.0–46.0)
Hemoglobin: 14.6 g/dL (ref 12.0–15.0)
Hemoglobin: 14.1 g/dL (ref 12.0–15.0)
LYMPHS ABS: 3.7 10*3/uL (ref 0.7–4.0)
LYMPHS PCT: 44 % (ref 12–46)
Lymphocytes Relative: 42 % (ref 12–46)
Lymphs Abs: 3.8 10*3/uL (ref 0.7–4.0)
MCH: 30.9 pg (ref 26.0–34.0)
MCH: 30.5 pg (ref 26.0–34.0)
MCHC: 34.8 g/dL (ref 30.0–36.0)
MCHC: 34.6 g/dL (ref 30.0–36.0)
MCV: 88.8 fL (ref 78.0–100.0)
MCV: 88.1 fL (ref 78.0–100.0)
MONO ABS: 0.6 10*3/uL (ref 0.1–1.0)
Monocytes Absolute: 0.6 10*3/uL (ref 0.1–1.0)
Monocytes Relative: 7 % (ref 3–12)
Monocytes Relative: 7 % (ref 3–12)
NEUTROS ABS: 4.3 10*3/uL (ref 1.7–7.7)
NEUTROS PCT: 49 % (ref 43–77)
Neutro Abs: 3.9 10*3/uL (ref 1.7–7.7)
Neutrophils Relative %: 47 % (ref 43–77)
PLATELETS: 294 10*3/uL (ref 150–400)
Platelets: 305 10*3/uL (ref 150–400)
RBC: 4.72 MIL/uL (ref 3.87–5.11)
RBC: 4.62 MIL/uL (ref 3.87–5.11)
RDW: 13 % (ref 11.5–15.5)
RDW: 12.8 % (ref 11.5–15.5)
WBC: 8.9 10*3/uL (ref 4.0–10.5)
WBC: 8.4 10*3/uL (ref 4.0–10.5)

## 2013-06-05 LAB — URINALYSIS, ROUTINE W REFLEX MICROSCOPIC
Bilirubin Urine: NEGATIVE
Glucose, UA: NEGATIVE mg/dL
Hgb urine dipstick: NEGATIVE
Ketones, ur: NEGATIVE mg/dL
Leukocytes, UA: NEGATIVE
Nitrite: NEGATIVE
Protein, ur: NEGATIVE mg/dL
Specific Gravity, Urine: 1.008 (ref 1.005–1.030)
Urobilinogen, UA: 0.2 mg/dL (ref 0.0–1.0)
pH: 6 (ref 5.0–8.0)

## 2013-06-05 LAB — CBG MONITORING, ED: Glucose-Capillary: 147 mg/dL — ABNORMAL HIGH (ref 70–99)

## 2013-06-05 LAB — I-STAT TROPONIN, ED: Troponin i, poc: 0 ng/mL (ref 0.00–0.08)

## 2013-06-05 LAB — POC URINE PREG, ED: Preg Test, Ur: NEGATIVE

## 2013-06-05 LAB — LIPASE, BLOOD
Lipase: 27 U/L (ref 11–59)
Lipase: 40 U/L (ref 11–59)

## 2013-06-05 MED ORDER — TRAMADOL HCL 50 MG PO TABS: 50.0000 mg | ORAL_TABLET | Freq: Four times a day (QID) | ORAL | Status: DC | PRN

## 2013-06-05 MED ORDER — SODIUM CHLORIDE 0.9 % IV SOLN
1000.0000 mL | Freq: Once | INTRAVENOUS | Status: AC
Start: 2013-06-05 — End: 2013-06-05
  Administered 2013-06-05: 1000 mL via INTRAVENOUS

## 2013-06-05 MED ORDER — ONDANSETRON HCL 4 MG/2ML IJ SOLN
4.0000 mg | Freq: Once | INTRAMUSCULAR | Status: AC
  Administered 2013-06-05: 4 mg via INTRAVENOUS
  Filled 2013-06-05: qty 2

## 2013-06-05 MED ORDER — SODIUM CHLORIDE 0.9 % IV SOLN
1000.0000 mL | INTRAVENOUS | Status: DC
  Administered 2013-06-05: 1000 mL via INTRAVENOUS

## 2013-06-05 MED ORDER — KETOROLAC TROMETHAMINE 30 MG/ML IJ SOLN
30.0000 mg | Freq: Once | INTRAMUSCULAR | Status: AC
  Administered 2013-06-05: 30 mg via INTRAVENOUS
  Filled 2013-06-05: qty 1

## 2013-06-05 MED ORDER — FENTANYL CITRATE 0.05 MG/ML IJ SOLN
50.0000 ug | INTRAMUSCULAR | Status: DC | PRN
  Administered 2013-06-05 (×2): 50 ug via INTRAVENOUS
  Filled 2013-06-05 (×2): qty 2

## 2013-06-05 NOTE — ED Notes (Signed)
Patient unable to void at this time

## 2013-06-05 NOTE — ED Notes (Signed)
Pt called again no answer 

## 2013-06-05 NOTE — ED Notes (Addendum)
Pt reports she was seen here on Monday for the same thing and was diagnosed with acid reflux. She states she didn't have the strength to get out of bed to get her prescription filled so she drove here today instead. She has been having abd pain, chest pain, sob, and blood in her stool since monday

## 2013-06-05 NOTE — ED Notes (Signed)
Pt was seen here on Saturday for same symptoms told it was acid reflux, pt states symptoms have not gotten any better. States she has had another bloody bowel movement on yesterday. Pt c/o abd and chest pain along with SOB.

## 2013-06-05 NOTE — ED Notes (Signed)
Pt called to reassess vitals and no answer  

## 2013-06-05 NOTE — ED Provider Notes (Signed)
CSN: [id number]     Arrival date & time 06/05/13  1710 History   First MD Initiated Contact with Patient 06/05/13 1732     Chief Complaint  Patient presents with  . Abdominal Pain  . Rectal Bleeding  . Chest Pain   HPI Patient presents to the emergency room for persistent abdominal pain. Patient states initially she started having symptoms on Monday. She was having trouble with vomiting and diarrhea. She noticed bright red blood in her stool.  She also started to feel that her abdomen was bloated. The symptoms were increasing. She is having difficulty with eating and drinking. She is also experiencing pain up towards her chest. Patient came to the emergency room on March 30 and was evaluated. Patient had laboratory tests as well as the CT scan. Please note that the patient was actually registered under an alternative medical record number, [id number].  Patient states she had laboratory tests and a CT scan. She was referred to followup with a primary care Dr.  patient states she called for an appointment is not until later this month. Patient continues to have abdominal pain. Today she feels like when she walks she starts to become short of breath. She had one episode of vomiting today. She did not have any diarrhea today however yesterday she did notice another episode of blood in her stool. Patient states the pain is primarily on the right side. It does not radiate. Nothing seems to be helping. She has not been on any recent antibiotics. She does not have any history of recent travel. 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 Diabetes mellitus without complication    Past Surgical History  Procedure Laterality Date  . Gastric bypass    . Abdominal hysterectomy     No family history on file. History  Substance Use Topics  . Smoking status: Never Smoker   . Smokeless tobacco: Not on file  . Alcohol Use: No   OB History   Grav Para Term Preterm Abortions  TAB SAB Ect Mult Living                 Review of Systems  All other systems reviewed and are negative.      Allergies  Morphine and related and Sulfa antibiotics  Home Medications   Current Outpatient Rx  Name  Route  Sig  Dispense  Refill  . Cinnamon 500 MG capsule   Oral   Take 1,000 mg by mouth daily.         . clonazePAM (KLONOPIN) 1 MG tablet   Oral   Take 1 mg by mouth at bedtime.         . Cyanocobalamin (B-12) 2000 MCG TABS   Oral   Take 4,000 mcg by mouth daily.         Marland Kitchen. FLUoxetine (PROZAC) 40 MG capsule   Oral   Take 40 mg by mouth at bedtime.         . Multiple Vitamin (MULTIVITAMIN WITH MINERALS) TABS tablet   Oral   Take 1 tablet by mouth daily.         Marland Kitchen. omeprazole (PRILOSEC) 20 MG capsule   Oral   Take 20 mg by mouth daily.         . traMADol (ULTRAM) 50 MG tablet   Oral   Take 1 tablet (50 mg total) by mouth every 6 (six) hours as needed.   20 tablet  0    BP 114/63  Pulse 74  Temp(Src) 97.6 F (36.4 C) (Oral)  Resp 14  SpO2 100% Physical Exam  Nursing note and vitals reviewed. Constitutional: She appears well-developed and well-nourished. No distress.  HENT:  Head: Normocephalic and atraumatic.  Right Ear: External ear normal.  Left Ear: External ear normal.  Eyes: Conjunctivae are normal. Right eye exhibits no discharge. Left eye exhibits no discharge. No scleral icterus.  Neck: Neck supple. No tracheal deviation present.  Cardiovascular: Normal rate, regular rhythm and intact distal pulses.   Pulmonary/Chest: Effort normal and breath sounds normal. No stridor. No respiratory distress. She has no wheezes. She has no rales.  Abdominal: Soft. Bowel sounds are normal. She exhibits no distension and no mass. There is tenderness in the right upper quadrant. There is no rebound and no guarding. No hernia.  Musculoskeletal: She exhibits no edema and no tenderness.  Neurological: She is alert. She has normal strength. No  cranial nerve deficit (no facial droop, extraocular movements intact, no slurred speech) or sensory deficit. She exhibits normal muscle tone. She displays no seizure activity. Coordination normal.  Skin: Skin is warm and dry. No rash noted.  Psychiatric: She has a normal mood and affect.    ED Course  Procedures (including critical care time) Labs Review Labs Reviewed  COMPREHENSIVE METABOLIC PANEL - Abnormal; Notable for the following:    Glucose, Bld 139 (*)    AST 39 (*)    ALT 53 (*)    Total Bilirubin <0.2 (*)    GFR calc non Af Amer 86 (*)    All other components within normal limits  CBG MONITORING, ED - Abnormal; Notable for the following:    Glucose-Capillary 147 (*)    All other components within normal limits  CBC WITH DIFFERENTIAL  LIPASE, BLOOD  URINALYSIS, ROUTINE W REFLEX MICROSCOPIC  I-STAT TROPOININ, ED  POC URINE PREG, ED   Imaging Review US Abdomen Complete  06/05/2013   CLINICAL DATA:  Abdominal pain  EXAM: ULTRASOUND ABDOMEN COMPLETE  COMPARISON:  Acute abdominal series radiographs 06/05/2013  FINDINGS: Gallbladder:  The gallbladder is surgically absent.  Common bile duct:  Diameter: Within normal limits at 5.6 mm  Liver:  No focal lesion identified. Within normal limits in parenchymal echogenicity.  IVC:  No abnormality visualized.  Pancreas:  Not visualized secondary to obscuring bowel gas.  Spleen:  Size and appearance within normal limits.  Right Kidney:  Length: 10.6 cm. Echogenicity within normal limits. No mass or hydronephrosis visualized.  Left Kidney:  Length: 11.5 cm. Echogenicity within normal limits. No mass or hydronephrosis visualized.  Abdominal aorta:  No aneurysm visualized.  Other findings:  None.  IMPRESSION: 1. No acute abnormality. 2. Surgical changes of prior cholecystectomy. 3. The pancreas is not well seen secondary to obscuring bowel gas.   Electronically Signed   By: Malachy Moan M.D.   On: 06/05/2013 21:41   Dg Abd Acute  W/chest  06/05/2013   CLINICAL DATA:  Right upper quadrant abdominal pain and rectal bleeding.  EXAM: ACUTE ABDOMEN SERIES (ABDOMEN 2 VIEW & CHEST 1 VIEW)  COMPARISON:  None.  FINDINGS: The upright chest x-ray is normal.  No acute pulmonary findings.  Two views of the abdomen demonstrate scattered air and stool in the colon and scattered air in the small bowel. No findings for obstruction an or perforation. The soft tissue shadows are maintained. No worrisome calcifications. The bony structures are intact.  IMPRESSION: Normal chest x-ray.  No plain film findings for obstruction or perforation.   Electronically Signed   By: Loralie Champagne M.D.   On: 06/05/2013 18:32    CT scan from previous visit 06/02/13 CLINICAL DATA: Right-sided abdominal pain. Previous gastric bypass.  EXAM:  CT ABDOMEN AND PELVIS WITH CONTRAST  TECHNIQUE:  Multidetector CT imaging of the abdomen and pelvis was performed  using the standard protocol following bolus administration of  intravenous contrast.  CONTRAST: OMNIPAQUE IOHEXOL 300 MG/ML SOLN, 25mL OMNIPAQUE  IOHEXOL 300 MG/ML SOLN  COMPARISON: Abdominal radiography same day  FINDINGS:  Lung bases are clear. No pleural or pericardial fluid. The liver is  normal. Previous cholecystectomy. The spleen is normal. The pancreas  is normal. The adrenal glands are normal. The kidneys are normal.  The aorta and IVC are normal. No retroperitoneal mass or adenopathy.  No free intraperitoneal fluid or air.  There has been previous gastric bypass surgery. Anastomotic bowel in  the left upper quadrant appears widely patent. I do not see dilated  loops to suggest obstruction. I cannot specifically identify the  appendix, but do not see any inflamed loops of bowel.  No significant bony finding. There has been previous hysterectomy.  IMPRESSION:  Gastric bypass without any evidence of complication. I do not see  evidence of bowel obstruction by CT. No other cause of  right-sided  abdominal pain is identified. I do not specifically identify the  appendix but cannot find any inflamed bowel   EKG Interpretation   Date/Time:  Thursday June 05 2013 17:29:12 EDT Ventricular Rate:  100 PR Interval:  165 QRS Duration: 91 QT Interval:  372 QTC Calculation: 480 R Axis:   -32 Text Interpretation:  Sinus tachycardia Left axis deviation Probable  anteroseptal infarct, old Baseline wander in lead(s) III aVF No previous  ECGs available Confirmed by [facility]  MD, Jonny Ruiz ([id number]) on 06/05/2013 5:41:17  PM      MDM   Final diagnoses:  Abdominal pain    Pt has had normal lab tests the past two days.  CT scan was unremarkable the other day.  Pt did not think she had her gallbladder removed so [place] was performed this evening however pt has had a prior cholecystectomy.  I informed the patient of this.  She did not fill her PPI.  I instructed her to do so.  Take ultram as needed.  Follow up with PVP or GI as planned   Celene Kras, MD 06/05/13 2208

## 2013-06-05 NOTE — ED Notes (Signed)
Pt ambulating independently w/ steady gait on d/c in no acute distress, A&Ox4. D/c instructions reviewed w/ pt and family - pt and family deny any further questions or concerns at present. Rx given x1  

## 2013-06-05 NOTE — Discharge Instructions (Signed)
Abdominal Pain, Adult °Many things can cause abdominal pain. Usually, abdominal pain is not caused by a disease and will improve without treatment. It can often be observed and treated at home. Your health care provider will do a physical exam and possibly order blood tests and X-rays to help determine the seriousness of your pain. However, in many cases, more time must pass before a clear cause of the pain can be found. Before that point, your health care provider may not know if you need more testing or further treatment. °HOME CARE INSTRUCTIONS  °Monitor your abdominal pain for any changes. The following actions may help to alleviate any discomfort you are experiencing: °· Only take over-the-counter or prescription medicines as directed by your health care provider. °· Do not take laxatives unless directed to do so by your health care provider. °· Try a clear liquid diet (broth, tea, or water) as directed by your health care provider. Slowly move to a bland diet as tolerated. °SEEK MEDICAL CARE IF: °· You have unexplained abdominal pain. °· You have abdominal pain associated with nausea or diarrhea. °· You have pain when you urinate or have a bowel movement. °· You experience abdominal pain that wakes you in the night. °· You have abdominal pain that is worsened or improved by eating food. °· You have abdominal pain that is worsened with eating fatty foods. °SEEK IMMEDIATE MEDICAL CARE IF:  °· Your pain does not go away within 2 hours. °· You have a fever. °· You keep throwing up (vomiting). °· Your pain is felt only in portions of the abdomen, such as the right side or the left lower portion of the abdomen. °· You pass bloody or black tarry stools. °MAKE SURE YOU: °· Understand these instructions.   °· Will watch your condition.   °· Will get help right away if you are not doing well or get worse.   °Document Released: 11/30/2004 Document Revised: 12/11/2012 Document Reviewed: 10/30/2012 °[employer]® Patient  Information ©2014 [employer]. ° °

## 2013-06-05 NOTE — Progress Notes (Signed)
   CARE MANAGEMENT ED NOTE 06/05/2013  Patient:  Wynelle BeckmannSTEVENSON,Janielle   Account Number:  [id number]  Date Initiated:  06/05/2013  Documentation initiated by:  Radford PaxFERRERO,Blas Riches  Subjective/Objective Assessment:   Patient presents to Ed with abdominal pain.     Subjective/Objective Assessment Detail:     Action/Plan:   Action/Plan Detail:   Anticipated DC Date:  06/05/2013     Status Recommendation to Physician:   Result of Recommendation:    Other ED Services  Consult Working Plan    DC Planning Services  Other  PCP issues    Choice offered to / List presented to:            Status of service:  Completed, signed off  ED Comments:   ED Comments Detail:  Patient confirms she does not have a pcp.  EDCM instructed patient to call the phone number on the back of her insurance card or go to insurance company website to help her find a pcp who is close to her and within network. Patient verbalized understanding.  No further EDCM needs at this time.

## 2013-06-10 ENCOUNTER — Encounter (HOSPITAL_COMMUNITY): Payer: Self-pay | Admitting: Emergency Medicine

## 2014-12-09 ENCOUNTER — Emergency Department (HOSPITAL_COMMUNITY)
Admission: EM | Admit: 2014-12-09 | Discharge: 2014-12-09 | Disposition: A | Discharge status: Home / Self Care | Payer: BLUE CROSS/BLUE SHIELD | Attending: Emergency Medicine | Admitting: Emergency Medicine

## 2014-12-09 ENCOUNTER — Emergency Department (HOSPITAL_COMMUNITY): Payer: BLUE CROSS/BLUE SHIELD

## 2014-12-09 ENCOUNTER — Encounter (HOSPITAL_COMMUNITY): Payer: Self-pay

## 2014-12-09 DIAGNOSIS — Z7982 Long term (current) use of aspirin: Secondary | ICD-10-CM | POA: Diagnosis not present

## 2014-12-09 DIAGNOSIS — Z79899 Other long term (current) drug therapy: Secondary | ICD-10-CM | POA: Insufficient documentation

## 2014-12-09 DIAGNOSIS — H538 Other visual disturbances: Secondary | ICD-10-CM | POA: Diagnosis not present

## 2014-12-09 DIAGNOSIS — K219 Gastro-esophageal reflux disease without esophagitis: Secondary | ICD-10-CM | POA: Diagnosis not present

## 2014-12-09 DIAGNOSIS — I10 Essential (primary) hypertension: Secondary | ICD-10-CM | POA: Insufficient documentation

## 2014-12-09 DIAGNOSIS — H539 Unspecified visual disturbance: Secondary | ICD-10-CM

## 2014-12-09 DIAGNOSIS — R51 Headache: Secondary | ICD-10-CM | POA: Insufficient documentation

## 2014-12-09 DIAGNOSIS — R519 Headache, unspecified: Secondary | ICD-10-CM

## 2014-12-09 DIAGNOSIS — E119 Type 2 diabetes mellitus without complications: Secondary | ICD-10-CM | POA: Diagnosis not present

## 2014-12-09 HISTORY — DX: Essential (primary) hypertension: I10

## 2014-12-09 HISTORY — DX: Migraine, unspecified, not intractable, without status migrainosus: G43.909

## 2014-12-09 HISTORY — DX: Gastro-esophageal reflux disease without esophagitis: K21.9

## 2014-12-09 LAB — CBC WITH DIFFERENTIAL/PLATELET
BASOS ABS: 0 10*3/uL (ref 0.0–0.1)
BASOS PCT: 0 %
Eosinophils Absolute: 0.1 10*3/uL (ref 0.0–0.7)
Eosinophils Relative: 2 %
HEMATOCRIT: 41.5 % (ref 36.0–46.0)
HEMOGLOBIN: 13.6 g/dL (ref 12.0–15.0)
Lymphocytes Relative: 41 %
Lymphs Abs: 2.6 10*3/uL (ref 0.7–4.0)
MCH: 29.9 pg (ref 26.0–34.0)
MCHC: 32.8 g/dL (ref 30.0–36.0)
MCV: 91.2 fL (ref 78.0–100.0)
MONOS PCT: 6 %
Monocytes Absolute: 0.4 10*3/uL (ref 0.1–1.0)
NEUTROS ABS: 3.2 10*3/uL (ref 1.7–7.7)
NEUTROS PCT: 51 %
Platelets: 266 10*3/uL (ref 150–400)
RBC: 4.55 MIL/uL (ref 3.87–5.11)
RDW: 13.5 % (ref 11.5–15.5)
WBC: 6.4 10*3/uL (ref 4.0–10.5)

## 2014-12-09 LAB — BASIC METABOLIC PANEL
ANION GAP: 4 — AB (ref 5–15)
BUN: 14 mg/dL (ref 6–20)
CALCIUM: 9.4 mg/dL (ref 8.9–10.3)
CO2: 23 mmol/L (ref 22–32)
Chloride: 113 mmol/L — ABNORMAL HIGH (ref 101–111)
Creatinine, Ser: 0.87 mg/dL (ref 0.44–1.00)
Glucose, Bld: 87 mg/dL (ref 65–99)
Potassium: 4.3 mmol/L (ref 3.5–5.1)
Sodium: 140 mmol/L (ref 135–145)

## 2014-12-09 LAB — SEDIMENTATION RATE: Sed Rate: 15 mm/hr (ref 0–22)

## 2014-12-09 MED ORDER — KETOROLAC TROMETHAMINE 30 MG/ML IJ SOLN
30.0000 mg | Freq: Once | INTRAMUSCULAR | Status: AC
  Administered 2014-12-09: 30 mg via INTRAMUSCULAR

## 2014-12-09 MED ORDER — SODIUM CHLORIDE 0.9 % IV BOLUS (SEPSIS): 1000.0000 mL | Freq: Once | INTRAVENOUS | Status: DC

## 2014-12-09 MED ORDER — KETOROLAC TROMETHAMINE 30 MG/ML IJ SOLN
30.0000 mg | Freq: Once | INTRAMUSCULAR | Status: DC
  Filled 2014-12-09: qty 1

## 2014-12-09 MED ORDER — SODIUM CHLORIDE 0.9 % IV BOLUS (SEPSIS)
1000.0000 mL | Freq: Once | INTRAVENOUS | Status: AC
  Administered 2014-12-09: 1000 mL via INTRAVENOUS

## 2014-12-09 NOTE — ED Notes (Signed)
Pt stated "that MRI made my h/a come back, it comes and goes.  It shoots into my right eye."

## 2014-12-09 NOTE — ED Notes (Signed)
Patient transported to MRI 

## 2014-12-09 NOTE — ED Notes (Signed)
Attempted x 2 person for a total of 3 attempts to start IV with no success. MRI ready for patient. WIl attempt to draw blood and start IV when patient returns.

## 2014-12-09 NOTE — ED Notes (Signed)
Pt still in MRI 

## 2014-12-09 NOTE — ED Notes (Addendum)
Pt c/o intermittent sharp R side, anterior, head pain x 3 days and blurred vision starting this morning.  Currently, denies pain.  Denies n/v/d. Denies light sensitivity.  Hx of migraines and reports taking Topamax.

## 2014-12-09 NOTE — Discharge Instructions (Signed)
As discussed, it is important that you follow up tomorrow morning with our eye physician for continued management of your condition.  If you develop any new, or concerning changes in your condition, please return to the emergency department immediately.

## 2014-12-09 NOTE — ED Provider Notes (Signed)
CSN: [id number]     Arrival date & time 12/09/14  1006 History   First MD Initiated Contact with Patient 12/09/14 1010     Chief Complaint  Patient presents with  . Headache  . Blurred Vision    HPI Patient presents with concern over new blurry vision, headache started first, about 3 days ago. Since onset headaches been waxing, waning, focally in the right temporal area.  Minimal relief with OTC medication. Blurred vision began today, and since onset also has been waxing/waning, not always concurrently with the head pain. There is no new headache, no fever, chills, confusion, disorientation. Patient has a history of diabetes, hypertension, migraines.   Past Medical History  Diagnosis Date  . Alcoholism (HCC)   . Diabetes mellitus without complication (HCC)   . GERD (gastroesophageal reflux disease)   . Migraines   . Hypertension    Past Surgical History  Procedure Laterality Date  . Gastric bypass    . Abdominal hysterectomy    . Cholecystectomy     History reviewed. No pertinent family history. Social History  Substance Use Topics  . Smoking status: Never Smoker   . Smokeless tobacco: None  . Alcohol Use: No     Comment: recovering alcoholic- last drink 06/2014   OB History    No data available     Review of Systems  Constitutional:       Per HPI, otherwise negative  HENT:       Per HPI, otherwise negative  Eyes: Positive for visual disturbance. Negative for photophobia, pain and redness.  Respiratory:       Per HPI, otherwise negative  Cardiovascular:       Per HPI, otherwise negative  Gastrointestinal: Negative for vomiting.  Endocrine:       Negative aside from HPI  Genitourinary:       Neg aside from HPI   Musculoskeletal:       Per HPI, otherwise negative  Skin: Negative.   Neurological: Positive for headaches. Negative for syncope.      Allergies  Morphine and related and Sulfa antibiotics  Home Medications   Prior to Admission medications    Medication Sig Start Date End Date Taking? Authorizing Provider  acyclovir (ZOVIRAX) 400 MG tablet Take 400 mg by mouth 2 (two) times daily.   Yes Historical Provider, MD  amitriptyline (ELAVIL) 25 MG tablet Take 50 mg by mouth at bedtime.   Yes Historical Provider, MD  aspirin EC 81 MG tablet Take 81 mg by mouth daily.   Yes Historical Provider, MD  Cholecalciferol (VITAMIN D3) 5000 UNITS TABS Take 1 tablet by mouth daily.   Yes Historical Provider, MD  Cyanocobalamin (B-12) 2000 MCG TABS Take 4,000 mcg by mouth daily.   Yes Historical Provider, MD  folic acid (FOLVITE) 1 MG tablet Take 1 mg by mouth daily.   Yes Historical Provider, MD  lisinopril-hydrochlorothiazide (PRINZIDE,ZESTORETIC) 10-12.5 MG tablet Take 1 tablet by mouth daily.   Yes Historical Provider, MD  metFORMIN (GLUCOPHAGE) 500 MG tablet Take 1 tablet (500 mg total) by mouth 3 (three) times daily. Patient taking differently: Take 500 mg by mouth daily.  04/28/13  Yes Thermon Leyland, NP  Multiple Vitamin (MULTIVITAMIN WITH MINERALS) TABS tablet Take 1 tablet by mouth daily.   Yes Historical Provider, MD  topiramate (TOPAMAX) 25 MG tablet Take 75 mg by mouth 2 (two) times daily.   Yes Historical Provider, MD  omeprazole (PRILOSEC) 20 MG capsule Take 1 capsule (20  mg total) by mouth daily. Patient not taking: Reported on 12/09/2014 06/02/13   Kristen N Ward, DO  traMADol (ULTRAM) 50 MG tablet Take 1 tablet (50 mg total) by mouth every 6 (six) hours as needed. Patient not taking: Reported on 12/09/2014 06/05/13   Linwood Dibbles, MD   BP 139/95 mmHg  Pulse 99  Temp(Src) 98.5 F (36.9 C) (Oral)  Resp 16  SpO2 100% Physical Exam  Constitutional: She is oriented to person, place, and time. She appears well-developed and well-nourished. No distress.  HENT:  Head: Normocephalic and atraumatic.  Eyes: Conjunctivae and EOM are normal.  Cardiovascular: Normal rate and regular rhythm.   Pulmonary/Chest: Effort normal and breath sounds normal.  No stridor. No respiratory distress.  Abdominal: She exhibits no distension.  Musculoskeletal: She exhibits no edema.  Neurological: She is alert and oriented to person, place, and time. She displays no atrophy and no tremor. No cranial nerve deficit or sensory deficit. She exhibits normal muscle tone. She displays no seizure activity. Coordination normal.  Skin: Skin is warm and dry.  Psychiatric: She has a normal mood and affect.  Nursing note and vitals reviewed.   ED Course  Procedures (including critical care time) Labs Review Labs Reviewed  BASIC METABOLIC PANEL - Abnormal; Notable for the following:    Chloride 113 (*)    Anion gap 4 (*)    All other components within normal limits  SEDIMENTATION RATE  CBC WITH DIFFERENTIAL/PLATELET    Imaging Review Mr Brain Wo Contrast  12/09/2014   CLINICAL DATA:  Intermittent sharp right-sided headache over the last 3 days. Blurred vision beginning this morning.  EXAM: MRI HEAD WITHOUT CONTRAST  TECHNIQUE: Multiplanar, multiecho pulse sequences of the brain and surrounding structures were obtained without intravenous contrast.  COMPARISON:  None.  FINDINGS: The study suffers from motion degradation. Diffusion imaging does not show any acute or subacute infarction. The brainstem and cerebellum are normal. There are scattered small foci of T2 and FLAIR signal in the hemispheric white matter of a minimal degree. This could represent the earliest manifestation of small vessel disease. No cortical or large vessel territory infarction. No mass lesion, hemorrhage, hydrocephalus or extra-axial collection. No pituitary mass. No inflammatory sinus disease. No skull or skullbase lesion. Major vessels at the base of the brain show flow.  IMPRESSION: No cause of the presenting symptoms is identified. No acute finding. Minimal small vessel change of the hemispheric white matter.   Electronically Signed   By: Paulina Fusi M.D.   On: 12/09/2014 12:10   I have  personally reviewed and evaluated these images and lab results as part of my medical decision-making.  Vision 20/20 right and left eye  O2 - 99%ra, nml  EMERGENCY DEPARTMENT [place] OCULAR EXAM "Study: Limited Ultrasound of Orbit "  INDICATIONS: Vision loss  Linear probe utilized to obtain images in both long and short axis of the orbit having the patient look left and right if possible.  PERFORMED BY: Myself  IMAGES ARCHIVED?: Yes  LIMITATIONS: none  VIEWS USED: Right orbit  INTERPRETATION: No retinal detachment, Lens in proper position, Normal Optic nerve diameter  On repeat exam the patient has no ongoing visual complaints. We discussed all findings, including MRI, ultrasound results. I discussed patient's case with our ophthalmologist on-call, and the patient will have a dilated exam tomorrow. MDM   Final diagnoses:  Vision changes  Bad headache    Patient p/w intermittent HA and visual changes.  With risk factors of  hypertension, diabetes, new pain with visual loss, MRI was performed, in addition to ocular ultrasound. No evidence for acute stroke, though there is evidence for microvascular changes. No evidence for retinal detachment. Patient's pain improved, and patient went back to normal. Arranged outpatient follow-up within 24 hours with our on-call ophthalmologist, and the patient was discharged in stable condition, after a discussion of return precautions.     Gerhard Munch, MD 12/09/14 [phone]

## 2014-12-09 NOTE — ED Notes (Signed)
Informed Dr. Jeraldine Loots of attempts to initiate IV without success & pt stating she had no pain @ present.

## 2015-03-19 ENCOUNTER — Emergency Department (HOSPITAL_COMMUNITY): Payer: BLUE CROSS/BLUE SHIELD

## 2015-03-19 ENCOUNTER — Encounter (HOSPITAL_COMMUNITY): Payer: Self-pay | Admitting: Emergency Medicine

## 2015-03-19 ENCOUNTER — Inpatient Hospital Stay (HOSPITAL_COMMUNITY)
Admission: EM | Admit: 2015-03-19 | Discharge: 2015-03-26 | DRG: 390 | Disposition: A | Discharge status: Home / Self Care | Payer: BLUE CROSS/BLUE SHIELD | Attending: Internal Medicine | Admitting: Internal Medicine

## 2015-03-19 DIAGNOSIS — Z6834 Body mass index (BMI) 34.0-34.9, adult: Secondary | ICD-10-CM

## 2015-03-19 DIAGNOSIS — Z90710 Acquired absence of both cervix and uterus: Secondary | ICD-10-CM

## 2015-03-19 DIAGNOSIS — K565 Intestinal adhesions [bands], unspecified as to partial versus complete obstruction: Secondary | ICD-10-CM | POA: Diagnosis present

## 2015-03-19 DIAGNOSIS — R109 Unspecified abdominal pain: Secondary | ICD-10-CM

## 2015-03-19 DIAGNOSIS — E669 Obesity, unspecified: Secondary | ICD-10-CM | POA: Diagnosis present

## 2015-03-19 DIAGNOSIS — I10 Essential (primary) hypertension: Secondary | ICD-10-CM

## 2015-03-19 DIAGNOSIS — E119 Type 2 diabetes mellitus without complications: Secondary | ICD-10-CM

## 2015-03-19 DIAGNOSIS — Z833 Family history of diabetes mellitus: Secondary | ICD-10-CM

## 2015-03-19 DIAGNOSIS — Z7984 Long term (current) use of oral hypoglycemic drugs: Secondary | ICD-10-CM

## 2015-03-19 DIAGNOSIS — K219 Gastro-esophageal reflux disease without esophagitis: Secondary | ICD-10-CM

## 2015-03-19 DIAGNOSIS — Z9884 Bariatric surgery status: Secondary | ICD-10-CM

## 2015-03-19 DIAGNOSIS — K56609 Unspecified intestinal obstruction, unspecified as to partial versus complete obstruction: Secondary | ICD-10-CM

## 2015-03-19 DIAGNOSIS — F4310 Post-traumatic stress disorder, unspecified: Secondary | ICD-10-CM | POA: Diagnosis present

## 2015-03-19 DIAGNOSIS — F1020 Alcohol dependence, uncomplicated: Secondary | ICD-10-CM

## 2015-03-19 DIAGNOSIS — Z882 Allergy status to sulfonamides status: Secondary | ICD-10-CM

## 2015-03-19 DIAGNOSIS — Z885 Allergy status to narcotic agent status: Secondary | ICD-10-CM

## 2015-03-19 DIAGNOSIS — G8929 Other chronic pain: Secondary | ICD-10-CM | POA: Diagnosis present

## 2015-03-19 DIAGNOSIS — Z9049 Acquired absence of other specified parts of digestive tract: Secondary | ICD-10-CM

## 2015-03-19 DIAGNOSIS — Z79899 Other long term (current) drug therapy: Secondary | ICD-10-CM

## 2015-03-19 DIAGNOSIS — Z8249 Family history of ischemic heart disease and other diseases of the circulatory system: Secondary | ICD-10-CM

## 2015-03-19 DIAGNOSIS — Z7982 Long term (current) use of aspirin: Secondary | ICD-10-CM

## 2015-03-19 DIAGNOSIS — F329 Major depressive disorder, single episode, unspecified: Secondary | ICD-10-CM | POA: Diagnosis present

## 2015-03-19 LAB — URINALYSIS, ROUTINE W REFLEX MICROSCOPIC
BILIRUBIN URINE: NEGATIVE
Glucose, UA: NEGATIVE mg/dL
Hgb urine dipstick: NEGATIVE
Ketones, ur: NEGATIVE mg/dL
NITRITE: NEGATIVE
PROTEIN: NEGATIVE mg/dL
SPECIFIC GRAVITY, URINE: 1.024 (ref 1.005–1.030)
pH: 5.5 (ref 5.0–8.0)

## 2015-03-19 LAB — COMPREHENSIVE METABOLIC PANEL
ALK PHOS: 111 U/L (ref 38–126)
ALT: 30 U/L (ref 14–54)
ANION GAP: 11 (ref 5–15)
AST: 43 U/L — ABNORMAL HIGH (ref 15–41)
Albumin: 4.5 g/dL (ref 3.5–5.0)
BILIRUBIN TOTAL: 0.7 mg/dL (ref 0.3–1.2)
BUN: 22 mg/dL — ABNORMAL HIGH (ref 6–20)
CALCIUM: 9.7 mg/dL (ref 8.9–10.3)
CO2: 20 mmol/L — ABNORMAL LOW (ref 22–32)
Chloride: 109 mmol/L (ref 101–111)
Creatinine, Ser: 0.92 mg/dL (ref 0.44–1.00)
Glucose, Bld: 145 mg/dL — ABNORMAL HIGH (ref 65–99)
Potassium: 4.2 mmol/L (ref 3.5–5.1)
Sodium: 140 mmol/L (ref 135–145)
TOTAL PROTEIN: 7.6 g/dL (ref 6.5–8.1)

## 2015-03-19 LAB — CBC
HCT: 40.2 % (ref 36.0–46.0)
HEMOGLOBIN: 13.4 g/dL (ref 12.0–15.0)
MCH: 29.6 pg (ref 26.0–34.0)
MCHC: 33.3 g/dL (ref 30.0–36.0)
MCV: 88.7 fL (ref 78.0–100.0)
Platelets: 296 10*3/uL (ref 150–400)
RBC: 4.53 MIL/uL (ref 3.87–5.11)
RDW: 13.1 % (ref 11.5–15.5)
WBC: 9.2 10*3/uL (ref 4.0–10.5)

## 2015-03-19 LAB — URINE MICROSCOPIC-ADD ON: RBC / HPF: NONE SEEN RBC/hpf (ref 0–5)

## 2015-03-19 LAB — LIPASE, BLOOD: Lipase: 15 U/L (ref 11–51)

## 2015-03-19 MED ORDER — IOHEXOL 300 MG/ML  SOLN
25.0000 mL | Freq: Once | INTRAMUSCULAR | Status: AC | PRN
  Administered 2015-03-19: 25 mL via ORAL

## 2015-03-19 MED ORDER — IOHEXOL 300 MG/ML  SOLN
100.0000 mL | Freq: Once | INTRAMUSCULAR | Status: AC | PRN
  Administered 2015-03-19: 100 mL via INTRAVENOUS

## 2015-03-19 MED ORDER — ONDANSETRON HCL 4 MG/2ML IJ SOLN
4.0000 mg | Freq: Once | INTRAMUSCULAR | Status: AC
  Administered 2015-03-19: 4 mg via INTRAVENOUS
  Filled 2015-03-19: qty 2

## 2015-03-19 MED ORDER — GI COCKTAIL ~~LOC~~
30.0000 mL | Freq: Once | ORAL | Status: AC
  Administered 2015-03-19: 30 mL via ORAL
  Filled 2015-03-19: qty 30

## 2015-03-19 MED ORDER — SODIUM CHLORIDE 0.9 % IV BOLUS (SEPSIS)
1000.0000 mL | Freq: Once | INTRAVENOUS | Status: AC
  Administered 2015-03-19: 1000 mL via INTRAVENOUS

## 2015-03-19 NOTE — ED Notes (Signed)
Per pt, states she is having a burning sensation in her abdomin-states getting worse-states now radiating to right back

## 2015-03-19 NOTE — ED Notes (Signed)
Provider at bedside

## 2015-03-19 NOTE — ED Notes (Signed)
Patient transported to X-ray 

## 2015-03-20 ENCOUNTER — Encounter (HOSPITAL_COMMUNITY): Payer: Self-pay | Admitting: Internal Medicine

## 2015-03-20 DIAGNOSIS — R1013 Epigastric pain: Secondary | ICD-10-CM | POA: Diagnosis not present

## 2015-03-20 DIAGNOSIS — E119 Type 2 diabetes mellitus without complications: Secondary | ICD-10-CM

## 2015-03-20 DIAGNOSIS — R1110 Vomiting, unspecified: Secondary | ICD-10-CM | POA: Diagnosis not present

## 2015-03-20 DIAGNOSIS — E669 Obesity, unspecified: Secondary | ICD-10-CM | POA: Diagnosis present

## 2015-03-20 DIAGNOSIS — K565 Intestinal adhesions [bands] with obstruction (postprocedural) (postinfection): Secondary | ICD-10-CM | POA: Diagnosis present

## 2015-03-20 DIAGNOSIS — Z7982 Long term (current) use of aspirin: Secondary | ICD-10-CM | POA: Diagnosis not present

## 2015-03-20 DIAGNOSIS — G8929 Other chronic pain: Secondary | ICD-10-CM | POA: Diagnosis present

## 2015-03-20 DIAGNOSIS — K56609 Unspecified intestinal obstruction, unspecified as to partial versus complete obstruction: Secondary | ICD-10-CM | POA: Diagnosis present

## 2015-03-20 DIAGNOSIS — Z9049 Acquired absence of other specified parts of digestive tract: Secondary | ICD-10-CM | POA: Diagnosis not present

## 2015-03-20 DIAGNOSIS — F329 Major depressive disorder, single episode, unspecified: Secondary | ICD-10-CM | POA: Diagnosis present

## 2015-03-20 DIAGNOSIS — Z882 Allergy status to sulfonamides status: Secondary | ICD-10-CM | POA: Diagnosis not present

## 2015-03-20 DIAGNOSIS — Z833 Family history of diabetes mellitus: Secondary | ICD-10-CM | POA: Diagnosis not present

## 2015-03-20 DIAGNOSIS — I10 Essential (primary) hypertension: Secondary | ICD-10-CM | POA: Diagnosis present

## 2015-03-20 DIAGNOSIS — F4310 Post-traumatic stress disorder, unspecified: Secondary | ICD-10-CM | POA: Diagnosis present

## 2015-03-20 DIAGNOSIS — Z9884 Bariatric surgery status: Secondary | ICD-10-CM | POA: Diagnosis not present

## 2015-03-20 DIAGNOSIS — Z6834 Body mass index (BMI) 34.0-34.9, adult: Secondary | ICD-10-CM | POA: Diagnosis not present

## 2015-03-20 DIAGNOSIS — R109 Unspecified abdominal pain: Secondary | ICD-10-CM | POA: Diagnosis present

## 2015-03-20 DIAGNOSIS — K219 Gastro-esophageal reflux disease without esophagitis: Secondary | ICD-10-CM | POA: Diagnosis present

## 2015-03-20 DIAGNOSIS — F1020 Alcohol dependence, uncomplicated: Secondary | ICD-10-CM | POA: Diagnosis present

## 2015-03-20 DIAGNOSIS — Z8249 Family history of ischemic heart disease and other diseases of the circulatory system: Secondary | ICD-10-CM | POA: Diagnosis not present

## 2015-03-20 DIAGNOSIS — K5669 Other intestinal obstruction: Secondary | ICD-10-CM | POA: Diagnosis not present

## 2015-03-20 DIAGNOSIS — Z90710 Acquired absence of both cervix and uterus: Secondary | ICD-10-CM | POA: Diagnosis not present

## 2015-03-20 DIAGNOSIS — Z79899 Other long term (current) drug therapy: Secondary | ICD-10-CM | POA: Diagnosis not present

## 2015-03-20 DIAGNOSIS — Z7984 Long term (current) use of oral hypoglycemic drugs: Secondary | ICD-10-CM | POA: Diagnosis not present

## 2015-03-20 DIAGNOSIS — R1033 Periumbilical pain: Secondary | ICD-10-CM | POA: Diagnosis not present

## 2015-03-20 DIAGNOSIS — Z885 Allergy status to narcotic agent status: Secondary | ICD-10-CM | POA: Diagnosis not present

## 2015-03-20 LAB — CBC
HEMATOCRIT: 37.7 % (ref 36.0–46.0)
Hemoglobin: 12.3 g/dL (ref 12.0–15.0)
MCH: 29.1 pg (ref 26.0–34.0)
MCHC: 32.6 g/dL (ref 30.0–36.0)
MCV: 89.1 fL (ref 78.0–100.0)
Platelets: 260 10*3/uL (ref 150–400)
RBC: 4.23 MIL/uL (ref 3.87–5.11)
RDW: 13.1 % (ref 11.5–15.5)
WBC: 7.8 10*3/uL (ref 4.0–10.5)

## 2015-03-20 LAB — BASIC METABOLIC PANEL
ANION GAP: 10 (ref 5–15)
BUN: 23 mg/dL — AB (ref 6–20)
CHLORIDE: 110 mmol/L (ref 101–111)
CO2: 22 mmol/L (ref 22–32)
Calcium: 9.1 mg/dL (ref 8.9–10.3)
Creatinine, Ser: 0.96 mg/dL (ref 0.44–1.00)
Glucose, Bld: 126 mg/dL — ABNORMAL HIGH (ref 65–99)
POTASSIUM: 3.9 mmol/L (ref 3.5–5.1)
SODIUM: 142 mmol/L (ref 135–145)

## 2015-03-20 LAB — CBG MONITORING, ED
GLUCOSE-CAPILLARY: 119 mg/dL — AB (ref 65–99)
GLUCOSE-CAPILLARY: 93 mg/dL (ref 65–99)

## 2015-03-20 LAB — GLUCOSE, CAPILLARY
GLUCOSE-CAPILLARY: 103 mg/dL — AB (ref 65–99)
GLUCOSE-CAPILLARY: 120 mg/dL — AB (ref 65–99)

## 2015-03-20 LAB — TYPE AND SCREEN
ABO/RH(D): O POS
Antibody Screen: NEGATIVE

## 2015-03-20 LAB — APTT: APTT: 28 s (ref 24–37)

## 2015-03-20 LAB — PROTIME-INR
INR: 1.07 (ref 0.00–1.49)
PROTHROMBIN TIME: 14.1 s (ref 11.6–15.2)

## 2015-03-20 LAB — ABO/RH: ABO/RH(D): O POS

## 2015-03-20 MED ORDER — HYDRALAZINE HCL 20 MG/ML IJ SOLN: 5.0000 mg | INTRAMUSCULAR | Status: DC | PRN

## 2015-03-20 MED ORDER — ACETAMINOPHEN 650 MG RE SUPP: 650.0000 mg | Freq: Four times a day (QID) | RECTAL | Status: DC | PRN

## 2015-03-20 MED ORDER — FENTANYL CITRATE (PF) 100 MCG/2ML IJ SOLN
50.0000 ug | Freq: Once | INTRAMUSCULAR | Status: AC
  Administered 2015-03-20: 50 ug via INTRAVENOUS
  Filled 2015-03-20: qty 2

## 2015-03-20 MED ORDER — VITAMIN B-12 1000 MCG PO TABS
4000.0000 ug | ORAL_TABLET | Freq: Every day | ORAL | Status: DC
  Administered 2015-03-24 – 2015-03-26 (×3): 4000 ug via ORAL
  Filled 2015-03-20 (×7): qty 4

## 2015-03-20 MED ORDER — HYDROMORPHONE HCL 1 MG/ML IJ SOLN
1.0000 mg | INTRAMUSCULAR | Status: AC | PRN
  Administered 2015-03-20 (×3): 1 mg via INTRAVENOUS
  Filled 2015-03-20 (×4): qty 1

## 2015-03-20 MED ORDER — ENOXAPARIN SODIUM 40 MG/0.4ML ~~LOC~~ SOLN
40.0000 mg | SUBCUTANEOUS | Status: DC
  Administered 2015-03-20 – 2015-03-26 (×7): 40 mg via SUBCUTANEOUS
  Filled 2015-03-20 (×8): qty 0.4

## 2015-03-20 MED ORDER — HYDROMORPHONE HCL 1 MG/ML IJ SOLN
1.0000 mg | Freq: Once | INTRAMUSCULAR | Status: AC
  Administered 2015-03-20: 1 mg via INTRAVENOUS

## 2015-03-20 MED ORDER — TOPIRAMATE 100 MG PO TABS
100.0000 mg | ORAL_TABLET | Freq: Two times a day (BID) | ORAL | Status: DC
  Administered 2015-03-22 – 2015-03-26 (×7): 100 mg via ORAL
  Filled 2015-03-20 (×13): qty 1

## 2015-03-20 MED ORDER — ASPIRIN EC 81 MG PO TBEC
81.0000 mg | DELAYED_RELEASE_TABLET | Freq: Every day | ORAL | Status: DC
  Administered 2015-03-22 – 2015-03-26 (×4): 81 mg via ORAL
  Filled 2015-03-20 (×7): qty 1

## 2015-03-20 MED ORDER — FLUOXETINE HCL 20 MG PO CAPS
40.0000 mg | ORAL_CAPSULE | Freq: Every day | ORAL | Status: DC
  Administered 2015-03-22 – 2015-03-26 (×4): 40 mg via ORAL
  Filled 2015-03-20 (×6): qty 2

## 2015-03-20 MED ORDER — VITAMIN D 1000 UNITS PO TABS
5000.0000 [IU] | ORAL_TABLET | Freq: Every day | ORAL | Status: DC
Start: 2015-03-20 — End: 2015-03-26
  Administered 2015-03-24 – 2015-03-26 (×3): 5000 [IU] via ORAL
  Filled 2015-03-20 (×7): qty 5

## 2015-03-20 MED ORDER — AMITRIPTYLINE HCL 50 MG PO TABS
50.0000 mg | ORAL_TABLET | Freq: Every day | ORAL | Status: DC
  Administered 2015-03-23 – 2015-03-25 (×3): 50 mg via ORAL
  Filled 2015-03-20 (×7): qty 1

## 2015-03-20 MED ORDER — ADULT MULTIVITAMIN W/MINERALS CH
1.0000 | ORAL_TABLET | Freq: Every day | ORAL | Status: DC
  Administered 2015-03-24 – 2015-03-26 (×3): 1 via ORAL
  Filled 2015-03-20 (×6): qty 1

## 2015-03-20 MED ORDER — HYDROMORPHONE HCL 1 MG/ML IJ SOLN
1.0000 mg | INTRAMUSCULAR | Status: DC | PRN
  Administered 2015-03-20 – 2015-03-26 (×25): 1 mg via INTRAVENOUS
  Filled 2015-03-20 (×26): qty 1

## 2015-03-20 MED ORDER — ACETAMINOPHEN 325 MG PO TABS: 650.0000 mg | ORAL_TABLET | Freq: Four times a day (QID) | ORAL | Status: DC | PRN

## 2015-03-20 MED ORDER — SODIUM CHLORIDE 0.9 % IV SOLN
Freq: Once | INTRAVENOUS | Status: AC
  Administered 2015-03-20: 02:00:00 via INTRAVENOUS

## 2015-03-20 MED ORDER — PROMETHAZINE HCL 25 MG/ML IJ SOLN
25.0000 mg | Freq: Four times a day (QID) | INTRAMUSCULAR | Status: DC | PRN
  Administered 2015-03-20 – 2015-03-26 (×15): 25 mg via INTRAVENOUS
  Filled 2015-03-20 (×16): qty 1

## 2015-03-20 MED ORDER — LISINOPRIL 5 MG PO TABS
5.0000 mg | ORAL_TABLET | Freq: Every day | ORAL | Status: DC
  Administered 2015-03-22 – 2015-03-26 (×4): 5 mg via ORAL
  Filled 2015-03-20 (×7): qty 1

## 2015-03-20 MED ORDER — SODIUM CHLORIDE 0.9 % IJ SOLN
3.0000 mL | Freq: Two times a day (BID) | INTRAMUSCULAR | Status: DC
  Administered 2015-03-20 – 2015-03-26 (×5): 3 mL via INTRAVENOUS

## 2015-03-20 MED ORDER — PHENOL 1.4 % MT LIQD
1.0000 | OROMUCOSAL | Status: DC | PRN
  Filled 2015-03-20: qty 177

## 2015-03-20 MED ORDER — INSULIN ASPART 100 UNIT/ML ~~LOC~~ SOLN
0.0000 [IU] | Freq: Three times a day (TID) | SUBCUTANEOUS | Status: DC
  Administered 2015-03-22 – 2015-03-23 (×3): 1 [IU] via SUBCUTANEOUS

## 2015-03-20 MED ORDER — METOCLOPRAMIDE HCL 5 MG/ML IJ SOLN
10.0000 mg | Freq: Once | INTRAMUSCULAR | Status: AC
  Administered 2015-03-20: 10 mg via INTRAVENOUS
  Filled 2015-03-20: qty 2

## 2015-03-20 MED ORDER — ONDANSETRON HCL 4 MG/2ML IJ SOLN
4.0000 mg | Freq: Three times a day (TID) | INTRAMUSCULAR | Status: AC | PRN
  Administered 2015-03-20: 4 mg via INTRAVENOUS
  Filled 2015-03-20: qty 2

## 2015-03-20 MED ORDER — ACYCLOVIR 400 MG PO TABS
400.0000 mg | ORAL_TABLET | Freq: Two times a day (BID) | ORAL | Status: DC
  Administered 2015-03-22 – 2015-03-26 (×7): 400 mg via ORAL
  Filled 2015-03-20 (×14): qty 1

## 2015-03-20 MED ORDER — PANTOPRAZOLE SODIUM 40 MG IV SOLR
40.0000 mg | INTRAVENOUS | Status: DC
  Administered 2015-03-20 – 2015-03-22 (×3): 40 mg via INTRAVENOUS
  Filled 2015-03-20 (×3): qty 40

## 2015-03-20 NOTE — ED Notes (Signed)
EKG given to Hospitalist,Nui for review. 

## 2015-03-20 NOTE — ED Provider Notes (Signed)
CSN: [id number]     Arrival date & time 03/19/15  1452 History   First MD Initiated Contact with Patient 03/19/15 2102     Chief Complaint  Patient presents with  . Abdominal Pain     (Consider location/radiation/quality/duration/timing/severity/associated sxs/prior Treatment) The history is provided by the patient.     Patient is a 49 year old female with history of GERD, diabetes, hypertension, abdominal surgical history including gastric bypass, cholecystectomy, hysterectomy, claims appendix is removed as well, she presents emergency Department with 4 days of abdominal pain described as burning and cramping, located in her central abdomen and radiating around her right side to her right flank. It is associated with vomiting and nausea.  Severity rated 10/10, and worse with any movement and with eating.  The pt states that she has had vomiting every time she has attempted to eat.  Emesis in non-bloody, non-bilious, consisting of food.  This morning she tried to eat oatmeal, and she vomited it back up.  At roughly noon she attempted to eat a sandwich and had more vomiting.  Total vomiting today 3x.  She has had nothing to eat or drink since.  She had a normal BM this morning, denies melena, hematochezia.  She denies fever, chills, sweats, diarrhea.  She has decreased urine volume but denies dysuria, hematuria.  She denies ETOH use.  No CP, SOB.  SHe denies sick contacts.  Past Medical History  Diagnosis Date  . Alcoholism (HCC)   . Diabetes mellitus without complication (HCC)   . GERD (gastroesophageal reflux disease)   . Migraines   . Hypertension    Past Surgical History  Procedure Laterality Date  . Gastric bypass    . Abdominal hysterectomy    . Cholecystectomy     Family History  Problem Relation Age of Onset  . Diabetes Mother   . Hypertension Mother   . Diabetes Father   . Hypertension Father   . Diabetes Brother   . Hypertension Brother   . Diabetes Sister    Social  History  Substance Use Topics  . Smoking status: Never Smoker   . Smokeless tobacco: None  . Alcohol Use: No     Comment: recovering alcoholic- last drink 06/2014   OB History    No data available     Review of Systems  Constitutional: Negative.  Negative for fever, activity change and appetite change.  HENT: Negative.   Eyes: Negative.   Respiratory: Negative.  Negative for chest tightness and shortness of breath.   Cardiovascular: Negative.  Negative for chest pain, palpitations and leg swelling.  Gastrointestinal: Positive for nausea, vomiting, abdominal pain and abdominal distention. Negative for diarrhea, constipation, blood in stool and rectal pain.  Endocrine: Negative.   Genitourinary: Negative.  Negative for urgency, frequency, hematuria, vaginal discharge, difficulty urinating, vaginal pain and pelvic pain.  Musculoskeletal: Negative.   Skin: Negative.   Neurological: Negative.   Psychiatric/Behavioral: Negative.       Allergies  Morphine and related and Sulfa antibiotics  Home Medications   Prior to Admission medications   Medication Sig Start Date End Date Taking? Authorizing Provider  acyclovir (ZOVIRAX) 400 MG tablet Take 400 mg by mouth 2 (two) times daily.   Yes Historical Provider, MD  amitriptyline (ELAVIL) 25 MG tablet Take 50 mg by mouth at bedtime.   Yes Historical Provider, MD  aspirin EC 81 MG tablet Take 81 mg by mouth daily.   Yes Historical Provider, MD  Cholecalciferol (VITAMIN D3) 5000 UNITS  TABS Take 1 tablet by mouth daily.   Yes Historical Provider, MD  Cyanocobalamin (B-12) 2000 MCG TABS Take 4,000 mcg by mouth daily.   Yes Historical Provider, MD  FLUoxetine (PROZAC) 40 MG capsule Take 40 mg by mouth daily. 02/23/15  Yes Historical Provider, MD  lisinopril-hydrochlorothiazide (PRINZIDE,ZESTORETIC) 10-12.5 MG tablet Take 1 tablet by mouth daily.   Yes Historical Provider, MD  metFORMIN (GLUCOPHAGE) 500 MG tablet Take 1 tablet (500 mg total) by  mouth 3 (three) times daily. 04/28/13  Yes Thermon Leyland, NP  Multiple Vitamin (MULTIVITAMIN WITH MINERALS) TABS tablet Take 1 tablet by mouth daily.   Yes Historical Provider, MD  topiramate (TOPAMAX) 100 MG tablet TAKE 1 TABLET (100 MG TOTAL) BY MOUTH 2 TIMES DAILY. 03/03/15  Yes Historical Provider, MD  omeprazole (PRILOSEC) 20 MG capsule Take 1 capsule (20 mg total) by mouth daily. Patient not taking: Reported on 12/09/2014 06/02/13   Kristen N Ward, DO  traMADol (ULTRAM) 50 MG tablet Take 1 tablet (50 mg total) by mouth every 6 (six) hours as needed. Patient not taking: Reported on 12/09/2014 06/05/13   Linwood Dibbles, MD   BP 108/78 mmHg  Pulse 93  Temp(Src) 98 F (36.7 C) (Oral)  Resp 10  SpO2 96% Physical Exam  Constitutional: She is oriented to person, place, and time. She appears well-developed and well-nourished. No distress.  HENT:  Head: Normocephalic and atraumatic.  Nose: Nose normal.  Mouth/Throat: No oropharyngeal exudate.  Oral mucosa dry  Eyes: Conjunctivae and EOM are normal. Pupils are equal, round, and reactive to light. Right eye exhibits no discharge. Left eye exhibits no discharge. No scleral icterus.  Neck: Normal range of motion. No JVD present. No tracheal deviation present. No thyromegaly present.  Cardiovascular: Normal rate, regular rhythm, normal heart sounds and intact distal pulses.  Exam reveals no gallop and no friction rub.   No murmur heard. Pulmonary/Chest: Effort normal and breath sounds normal. No respiratory distress. She has no wheezes. She has no rales. She exhibits no tenderness.  Abdominal: Soft. Normal appearance and bowel sounds are normal. She exhibits no distension and no mass. There is tenderness. There is rebound and guarding. There is no rigidity. No hernia.    Midline abdominal scar   Musculoskeletal: Normal range of motion. She exhibits no edema or tenderness.  Lymphadenopathy:    She has no cervical adenopathy.  Neurological: She is alert  and oriented to person, place, and time. She has normal reflexes. No cranial nerve deficit. She exhibits normal muscle tone. Coordination normal.  Skin: Skin is warm and dry. No rash noted. She is not diaphoretic. No erythema. No pallor.  Psychiatric: She has a normal mood and affect. Her behavior is normal. Judgment and thought content normal.  Nursing note and vitals reviewed.   ED Course  Procedures (including critical care time) Labs Review Labs Reviewed  COMPREHENSIVE METABOLIC PANEL - Abnormal; Notable for the following:    CO2 20 (*)    Glucose, Bld 145 (*)    BUN 22 (*)    AST 43 (*)    All other components within normal limits  URINALYSIS, ROUTINE W REFLEX MICROSCOPIC (NOT AT [facility]) - Abnormal; Notable for the following:    APPearance CLOUDY (*)    Leukocytes, UA TRACE (*)    All other components within normal limits  URINE MICROSCOPIC-ADD ON - Abnormal; Notable for the following:    Squamous Epithelial / LPF 0-5 (*)    Bacteria, UA FEW (*)  Casts HYALINE CASTS (*)    All other components within normal limits  LIPASE, BLOOD  CBC  PROTIME-INR  APTT  HEMOGLOBIN A1C  BASIC METABOLIC PANEL  CBC  TYPE AND SCREEN  ABO/RH    Imaging Review Ct Abdomen Pelvis W Contrast  03/19/2015  CLINICAL DATA:  Burning sensation the abdomen radiates the back. Pain for 3 days EXAM: CT ABDOMEN AND PELVIS WITH CONTRAST TECHNIQUE: Multidetector CT imaging of the abdomen and pelvis was performed using the standard protocol following bolus administration of intravenous contrast. CONTRAST:  25mL OMNIPAQUE IOHEXOL 300 MG/ML SOLN, OMNIPAQUE IOHEXOL 300 MG/ML SOLN COMPARISON:  None. FINDINGS: Lower chest: Lung bases are clear. Hepatobiliary: No focal hepatic lesion. Postcholecystectomy. No biliary dilatation. Pancreas: Pancreas is normal. No ductal dilatation. No pancreatic inflammation. Spleen: Normal spleen Adrenals/urinary tract: Adrenal glands and kidneys are normal. The ureters and bladder  normal. Stomach/Bowel: status post gastric bypass surgery. Contrast flows through the gastrojejunostomy into the efferent limb. There is stasis of enteric contents of this limb leading up to the enteric enteric anastomosis in the lower mid abdomen. Loops of bowel measure up to 6 cm at this level (image 59, series 2). There is stasis of enteric contents with fecalization of this stool in the small bowel. The more distal small bowel is collapsed. Colon is relatively collapsed. No free fluid.  No pneumatosis. Vascular/Lymphatic: Abdominal aorta is normal caliber. There is no retroperitoneal or periportal lymphadenopathy. No pelvic lymphadenopathy. Reproductive: Post hysterectomy anatomy Other: No free fluid. Musculoskeletal: No aggressive osseous lesion. IMPRESSION: 1. Small bowel obstruction at the level of the distal enteric enteric anastomosis. This obstruction is long standing with the loops of small bowel significantly distended up to 6 cm and stasis of enteric content with fecalization of stool. Obstruction appears fairly high-grade at this point. 2. More proximal gastrojejunostomy is patent. Electronically Signed   By: Genevive Bi M.D.   On: 03/19/2015 23:38   Dg Abd Acute W/chest  03/19/2015  CLINICAL DATA:  Burning sensation abdomen. EXAM: DG ABDOMEN ACUTE W/ 1V CHEST COMPARISON:  None. FINDINGS: Lungs are clear.  No free air beneath hemidiaphragm. No dilated loops of large or small bowel. Stool in the RIGHT colon. Gas in the rectum. No pathologic calcifications. No organomegaly. Postcholecystectomy. IMPRESSION: 1.  No acute cardiopulmonary process. 2. No bowel obstruction or intraperitoneal free air. Electronically Signed   By: Genevive Bi M.D.   On: 03/19/2015 21:30   I have personally reviewed and evaluated these images and lab results as part of my medical decision-making.   EKG Interpretation None      MDM   Pt with multiple abd surgeries, presents with Abd pain periumbilical and  right sided, with V and N, x 4 days.  Labs resulted at the time of my exam.  Mildly elevated AST, low CO2, no white count, UA with trace leukocytes, neg nitrites.  Abd 2v + chest shows stool in right colon, gas in rectum, no free air, no visible dilated loops of large or small bowel. On exam pt has peritoneal signs, + rebound tenderness.  Vitals stable, pt appears uncomfortable, does not appear distressed, mildly dehydrated.  CT abd/pelvis with contrast, pt started vomiting.  CT + for SBO "IMPRESSION: 1. Small bowel obstruction at the level of the distal enteric enteric anastomosis. This obstruction is long standing with the loops of small bowel significantly distended up to 6 cm and stasis of enteric content with fecalization of stool. Obstruction appears fairly high-grade at this  point. 2. More proximal gastrojejunostomy is patent."  Surgery consulted - Dr. Carolynne Edouardoth, results read to him, he advised IVF and NG tube, states he will see pt in "a few hours"  [facility] called for admission.  Dr. Verdie MosherLiu to admit.    Final diagnoses:  SBO (small bowel obstruction) (HCC)      Danelle BerryLeisa Sherwin Hollingshed, PA-C 03/20/15 [id number]  Nelva Nayobert Beaton, MD 03/24/15 [phone]

## 2015-03-20 NOTE — H&P (Signed)
[facility] History and Physical  Janice Santos Meader ZOX:[id number] DOB: 01/23/1966 DOA: 03/19/2015  Referring physician: ED physician PCP: No primary care provider on file.  Specialists:   Chief Complaint: Abdominal pain, nausea and vomiting  HPI: Janice Santos Pitcock is a 50 y.o. female with PMH of gastric bypass, partial hysterectomy, cholecystectomy, hypertension, diabetes mellitus, GERD, depression, alcohol abuse (quit on 06/2014), migraine headache, PTSD, who presents with abdominal pain, nausea and vomiting.  Patient reports that she has been having nausea, vomiting, abdominal pain in the past 5 days. Her abdominal pain is located in the right side of abdomen, constant, 10 out of 10 in severity, radiating to R back. She vomited 5 times yesterday, no diarrhea. Her last bowel movement was yesterday morning. Patient does not have chest pain, shortness of breath, cough, symptoms of UTI or unilateral weakness. She reports that she quit drinking alcohol on 06/2014.  In ED, patient was found to have lipase of 15, WBC 9.2, temperature normal, electrolytes, renal function okay. CT abdomen/pelvis that showed high-grade small bowel obstruction. Patient's admitted to inpatient for further eval and treatment. Surgery was consulted by EDP.  EKG: Not done in ED, we'll get one.  Where does patient live?   At home   Can patient participate in ADLs?  Yes   Review of Systems:   General: no fevers, chills, no changes in body weight, has poor appetite, has fatigue HEENT: no blurry vision, hearing changes or sore throat Pulm: no dyspnea, coughing, wheezing CV: no chest pain, palpitations Abd: has nausea, vomiting, abdominal pain, no diarrhea, constipation GU: no dysuria, burning on urination, increased urinary frequency, hematuria  Ext: no leg edema Neuro: no unilateral weakness, numbness, or tingling, no vision change or hearing loss Skin: no rash MSK: No muscle spasm, no deformity, no limitation of range of  movement in spin Heme: No easy bruising.  Travel history: No recent long distant travel.  Allergy:  Allergies  Allergen Reactions  . Morphine And Related Hives and Itching  . Sulfa Antibiotics Hives and Itching    Past Medical History  Diagnosis Date  . Alcoholism (HCC)   . Diabetes mellitus without complication (HCC)   . GERD (gastroesophageal reflux disease)   . Migraines   . Hypertension     Past Surgical History  Procedure Laterality Date  . Gastric bypass    . Abdominal hysterectomy    . Cholecystectomy      Social History:  reports that she has never smoked. She does not have any smokeless tobacco history on file. She reports that she does not drink alcohol or use illicit drugs.  Family History:  Family History  Problem Relation Age of Onset  . Diabetes Mother   . Hypertension Mother   . Diabetes Father   . Hypertension Father   . Diabetes Brother   . Hypertension Brother   . Diabetes Sister      Prior to Admission medications   Medication Sig Start Date End Date Taking? Authorizing Provider  acyclovir (ZOVIRAX) 400 MG tablet Take 400 mg by mouth 2 (two) times daily.   Yes Historical Provider, MD  amitriptyline (ELAVIL) 25 MG tablet Take 50 mg by mouth at bedtime.   Yes Historical Provider, MD  aspirin EC 81 MG tablet Take 81 mg by mouth daily.   Yes Historical Provider, MD  Cholecalciferol (VITAMIN D3) 5000 UNITS TABS Take 1 tablet by mouth daily.   Yes Historical Provider, MD  Cyanocobalamin (B-12) 2000 MCG TABS Take 4,000 mcg by  mouth daily.   Yes Historical Provider, MD  FLUoxetine (PROZAC) 40 MG capsule Take 40 mg by mouth daily. 02/23/15  Yes Historical Provider, MD  lisinopril-hydrochlorothiazide (PRINZIDE,ZESTORETIC) 10-12.5 MG tablet Take 1 tablet by mouth daily.   Yes Historical Provider, MD  metFORMIN (GLUCOPHAGE) 500 MG tablet Take 1 tablet (500 mg total) by mouth 3 (three) times daily. 04/28/13  Yes Thermon Leyland, NP  Multiple Vitamin (MULTIVITAMIN  WITH MINERALS) TABS tablet Take 1 tablet by mouth daily.   Yes Historical Provider, MD  topiramate (TOPAMAX) 100 MG tablet TAKE 1 TABLET (100 MG TOTAL) BY MOUTH 2 TIMES DAILY. 03/03/15  Yes Historical Provider, MD  omeprazole (PRILOSEC) 20 MG capsule Take 1 capsule (20 mg total) by mouth daily. Patient not taking: Reported on 12/09/2014 06/02/13   Kristen N Ward, DO  traMADol (ULTRAM) 50 MG tablet Take 1 tablet (50 mg total) by mouth every 6 (six) hours as needed. Patient not taking: Reported on 12/09/2014 06/05/13   Linwood Dibbles, MD    Physical Exam: Filed Vitals:   03/19/15 1507 03/19/15 2227  BP: 108/93 130/86  Pulse: 105 85  Temp: 98 F (36.7 C)   TempSrc: Oral   Resp: 16 18  SpO2: 100% 100%   General: Not in acute distress HEENT:       Eyes: PERRL, EOMI, no scleral icterus.       ENT: No discharge from the ears and nose, no pharynx injection, no tonsillar enlargement.        Neck: No JVD, no bruit, no mass felt. Heme: No neck lymph node enlargement. Cardiac: S1/S2, RRR, No murmurs, No gallops or rubs. Pulm: No rales, wheezing, rhonchi or rubs. Abd: Soft, nondistended, tenderness over right side of abdomen, no rebound pain, no organomegaly, BS present. Ext: No pitting leg edema bilaterally. 2+DP/PT pulse bilaterally. Musculoskeletal: No joint deformities, No joint redness or warmth, no limitation of ROM in spin. Skin: No rashes.  Neuro: Alert, oriented X3, cranial nerves II-XII grossly intact, muscle strength 5/5 in all extremities, sensation to light touch intact.  Psych: Patient is not psychotic, no suicidal or hemocidal ideation.  Labs on Admission:  Basic Metabolic Panel:  Recent Labs Lab 03/19/15 1609  NA 140  K 4.2  CL 109  CO2 20*  GLUCOSE 145*  BUN 22*  CREATININE 0.92  CALCIUM 9.7   Liver Function Tests:  Recent Labs Lab 03/19/15 1609  AST 43*  ALT 30  ALKPHOS 111  BILITOT 0.7  PROT 7.6  ALBUMIN 4.5    Recent Labs Lab 03/19/15 1609  LIPASE 15    No results for input(s): AMMONIA in the last 168 hours. CBC:  Recent Labs Lab 03/19/15 1609  WBC 9.2  HGB 13.4  HCT 40.2  MCV 88.7  PLT 296   Cardiac Enzymes: No results for input(s): CKTOTAL, CKMB, CKMBINDEX, TROPONINI in the last 168 hours.  BNP (last 3 results) No results for input(s): BNP in the last 8760 hours.  ProBNP (last 3 results) No results for input(s): PROBNP in the last 8760 hours.  CBG: No results for input(s): GLUCAP in the last 168 hours.  Radiological Exams on Admission: Ct Abdomen Pelvis W Contrast  03/19/2015  CLINICAL DATA:  Burning sensation the abdomen radiates the back. Pain for 3 days EXAM: CT ABDOMEN AND PELVIS WITH CONTRAST TECHNIQUE: Multidetector CT imaging of the abdomen and pelvis was performed using the standard protocol following bolus administration of intravenous contrast. CONTRAST:  25mL OMNIPAQUE IOHEXOL 300 MG/ML SOLN,  OMNIPAQUE IOHEXOL 300 MG/ML SOLN COMPARISON:  None. FINDINGS: Lower chest: Lung bases are clear. Hepatobiliary: No focal hepatic lesion. Postcholecystectomy. No biliary dilatation. Pancreas: Pancreas is normal. No ductal dilatation. No pancreatic inflammation. Spleen: Normal spleen Adrenals/urinary tract: Adrenal glands and kidneys are normal. The ureters and bladder normal. Stomach/Bowel: status post gastric bypass surgery. Contrast flows through the gastrojejunostomy into the efferent limb. There is stasis of enteric contents of this limb leading up to the enteric enteric anastomosis in the lower mid abdomen. Loops of bowel measure up to 6 cm at this level (image 59, series 2). There is stasis of enteric contents with fecalization of this stool in the small bowel. The more distal small bowel is collapsed. Colon is relatively collapsed. No free fluid.  No pneumatosis. Vascular/Lymphatic: Abdominal aorta is normal caliber. There is no retroperitoneal or periportal lymphadenopathy. No pelvic lymphadenopathy. Reproductive: Post  hysterectomy anatomy Other: No free fluid. Musculoskeletal: No aggressive osseous lesion. IMPRESSION: 1. Small bowel obstruction at the level of the distal enteric enteric anastomosis. This obstruction is long standing with the loops of small bowel significantly distended up to 6 cm and stasis of enteric content with fecalization of stool. Obstruction appears fairly high-grade at this point. 2. More proximal gastrojejunostomy is patent. Electronically Signed   By: Genevive Bi M.D.   On: 03/19/2015 23:38   Dg Abd Acute W/chest  03/19/2015  CLINICAL DATA:  Burning sensation abdomen. EXAM: DG ABDOMEN ACUTE W/ 1V CHEST COMPARISON:  None. FINDINGS: Lungs are clear.  No free air beneath hemidiaphragm. No dilated loops of large or small bowel. Stool in the RIGHT colon. Gas in the rectum. No pathologic calcifications. No organomegaly. Postcholecystectomy. IMPRESSION: 1.  No acute cardiopulmonary process. 2. No bowel obstruction or intraperitoneal free air. Electronically Signed   By: Genevive Bi M.D.   On: 03/19/2015 21:30    Assessment/Plan Principal Problem:   SBO (small bowel obstruction) (HCC) Active Problems:   Major depressive disorder (HCC)   PTSD (post-traumatic stress disorder)   Diabetes mellitus without complication (HCC)   GERD (gastroesophageal reflux disease)   Hypertension   SBO (small bowel obstruction) (HCC): Patient has high-grade small bowel obstruction as evidenced by CT abdomen/pelvis. It is likely due to adhesion 2/2 previous surgeries, including gastric bypass surgery, partial hysterectomy and cholecystectomy. Currently patient is not septic. Hemodynamically stable. Surgeon, Dr. Carolynne Edouard was consulted by EDP.  -Will admit to tele bed. -IVF: Normal saline 1 L, followed by 1 25 mL per hour -When necessary Zofran for nausea, Dilaudid for pain -Follow-up surgeon's recommendation -INR/PTT/type & screen -prn NGT  DM-II: Last A1c not on record. Patient is taking metformin at  home -SSI -Check A1c  GERD: -Protonix  HTN: -Switch Prinzide to lisinopril since patient needs IV fluid -IV hydralazine.  Major depressive disorder (HCC) and PTSD (post-traumatic stress disorder): Stable, no suicidal or homicidal ideations. -Continue home medications: Amitriptyline, Prozac, Topamax  DVT ppx: SCD  Code Status: Full code Family Communication: None at bed side. Disposition Plan: Admit to inpatient   Date of Service 03/20/2015    Lorretta Harp [facility] Pager [phone]  If 7PM-7AM, please contact night-coverage [email or web address] Password [email or web address] 03/20/2015, 1:36 AM

## 2015-03-20 NOTE — Consult Note (Signed)
Reason for Consult: Vomiting Referring Physician: Dr. Arrie Eastern is an 50 y.o. female.  HPI:  The patient is a 50 year old black female who presents to the emergency department with abdominal pain that has been occurring for the last week. The pain is been associated with nausea and vomiting. The vomiting started on Thursday of this week. Her last bowel movement was also on Thursday. She has had previous bowel obstructions similar to this that resolved without surgery. The last one was in April 2016. She has a history of an open gastric bypass done in [place] about 15 years ago. She lost a significant amount of weight after the surgery.  Past Medical History  Diagnosis Date  . Alcoholism ([place])   . Diabetes mellitus without complication ([place])   . GERD (gastroesophageal reflux disease)   . Migraines   . Hypertension     Past Surgical History  Procedure Laterality Date  . Gastric bypass    . Abdominal hysterectomy    . Cholecystectomy      Family History  Problem Relation Age of Onset  . Diabetes Mother   . Hypertension Mother   . Diabetes Father   . Hypertension Father   . Diabetes Brother   . Hypertension Brother   . Diabetes Sister     Social History:  reports that she has never smoked. She does not have any smokeless tobacco history on file. She reports that she does not drink alcohol or use illicit drugs.  Allergies:  Allergies  Allergen Reactions  . Morphine And Related Hives and Itching  . Sulfa Antibiotics Hives and Itching    Medications: I have reviewed the patient's current medications.  Results for orders placed or performed during the hospital encounter of 03/19/15 (from the past 48 hour(s))  Lipase, blood     Status: None   Collection Time: 03/19/15  4:09 PM  Result Value Ref Range   Lipase 15 11 - 51 U/L  Comprehensive metabolic panel     Status: Abnormal   Collection Time: 03/19/15  4:09 PM  Result Value Ref Range   Sodium 140 135 - 145  mmol/L   Potassium 4.2 3.5 - 5.1 mmol/L   Chloride 109 101 - 111 mmol/L   CO2 20 (L) 22 - 32 mmol/L   Glucose, Bld 145 (H) 65 - 99 mg/dL   BUN 22 (H) 6 - 20 mg/dL   Creatinine, Ser 0.92 0.44 - 1.00 mg/dL   Calcium 9.7 8.9 - 10.3 mg/dL   Total Protein 7.6 6.5 - 8.1 g/dL   Albumin 4.5 3.5 - 5.0 g/dL   AST 43 (H) 15 - 41 U/L   ALT 30 14 - 54 U/L   Alkaline Phosphatase 111 38 - 126 U/L   Total Bilirubin 0.7 0.3 - 1.2 mg/dL   GFR calc non Af Amer >60 >60 mL/min   GFR calc Af Amer >60 >60 mL/min    Comment: (NOTE) The eGFR has been calculated using the CKD EPI equation. This calculation has not been validated in all clinical situations. eGFR's persistently <60 mL/min signify possible Chronic Kidney Disease.    Anion gap 11 5 - 15  CBC     Status: None   Collection Time: 03/19/15  4:09 PM  Result Value Ref Range   WBC 9.2 4.0 - 10.5 K/uL   RBC 4.53 3.87 - 5.11 MIL/uL   Hemoglobin 13.4 12.0 - 15.0 g/dL   HCT 40.2 36.0 - 46.0 %   MCV  88.7 78.0 - 100.0 fL   MCH 29.6 26.0 - 34.0 pg   MCHC 33.3 30.0 - 36.0 g/dL   RDW 13.1 11.5 - 15.5 %   Platelets 296 150 - 400 K/uL  Urinalysis, Routine w reflex microscopic (not at [facility])     Status: Abnormal   Collection Time: 03/19/15  8:48 PM  Result Value Ref Range   Color, Urine YELLOW YELLOW   APPearance CLOUDY (A) CLEAR   Specific Gravity, Urine 1.024 1.005 - 1.030   pH 5.5 5.0 - 8.0   Glucose, UA NEGATIVE NEGATIVE mg/dL   Hgb urine dipstick NEGATIVE NEGATIVE   Bilirubin Urine NEGATIVE NEGATIVE   Ketones, ur NEGATIVE NEGATIVE mg/dL   Protein, ur NEGATIVE NEGATIVE mg/dL   Nitrite NEGATIVE NEGATIVE   Leukocytes, UA TRACE (A) NEGATIVE  Urine microscopic-add on     Status: Abnormal   Collection Time: 03/19/15  8:48 PM  Result Value Ref Range   Squamous Epithelial / LPF 0-5 (A) NONE SEEN   WBC, UA 6-30 0 - 5 WBC/hpf   RBC / HPF NONE SEEN 0 - 5 RBC/hpf   Bacteria, UA FEW (A) NONE SEEN   Casts HYALINE CASTS (A) NEGATIVE  Protime-INR      Status: None   Collection Time: 03/20/15  2:50 AM  Result Value Ref Range   Prothrombin Time 14.1 11.6 - 15.2 seconds   INR 1.07 0.00 - 1.49  APTT     Status: None   Collection Time: 03/20/15  2:50 AM  Result Value Ref Range   aPTT 28 24 - 37 seconds  Type and screen [place]     Status: None   Collection Time: 03/20/15  2:52 AM  Result Value Ref Range   ABO/RH(D) O POS    Antibody Screen NEG    Sample Expiration 46/96/2952   Basic metabolic panel     Status: Abnormal   Collection Time: 03/20/15  5:08 AM  Result Value Ref Range   Sodium 142 135 - 145 mmol/L   Potassium 3.9 3.5 - 5.1 mmol/L   Chloride 110 101 - 111 mmol/L   CO2 22 22 - 32 mmol/L   Glucose, Bld 126 (H) 65 - 99 mg/dL   BUN 23 (H) 6 - 20 mg/dL   Creatinine, Ser 0.96 0.44 - 1.00 mg/dL   Calcium 9.1 8.9 - 10.3 mg/dL   GFR calc non Af Amer >60 >60 mL/min   GFR calc Af Amer >60 >60 mL/min    Comment: (NOTE) The eGFR has been calculated using the CKD EPI equation. This calculation has not been validated in all clinical situations. eGFR's persistently <60 mL/min signify possible Chronic Kidney Disease.    Anion gap 10 5 - 15  CBC     Status: None   Collection Time: 03/20/15  5:08 AM  Result Value Ref Range   WBC 7.8 4.0 - 10.5 K/uL   RBC 4.23 3.87 - 5.11 MIL/uL   Hemoglobin 12.3 12.0 - 15.0 g/dL   HCT 37.7 36.0 - 46.0 %   MCV 89.1 78.0 - 100.0 fL   MCH 29.1 26.0 - 34.0 pg   MCHC 32.6 30.0 - 36.0 g/dL   RDW 13.1 11.5 - 15.5 %   Platelets 260 150 - 400 K/uL    Ct Abdomen Pelvis W Contrast  03/19/2015  CLINICAL DATA:  Burning sensation the abdomen radiates the back. Pain for 3 days EXAM: CT ABDOMEN AND PELVIS WITH CONTRAST TECHNIQUE: Multidetector CT  imaging of the abdomen and pelvis was performed using the standard protocol following bolus administration of intravenous contrast. CONTRAST:  28m OMNIPAQUE IOHEXOL 300 MG/ML SOLN, 1046mOMNIPAQUE IOHEXOL 300 MG/ML SOLN COMPARISON:  None.  FINDINGS: Lower chest: Lung bases are clear. Hepatobiliary: No focal hepatic lesion. Postcholecystectomy. No biliary dilatation. Pancreas: Pancreas is normal. No ductal dilatation. No pancreatic inflammation. Spleen: Normal spleen Adrenals/urinary tract: Adrenal glands and kidneys are normal. The ureters and bladder normal. Stomach/Bowel: status post gastric bypass surgery. Contrast flows through the gastrojejunostomy into the efferent limb. There is stasis of enteric contents of this limb leading up to the enteric enteric anastomosis in the lower mid abdomen. Loops of bowel measure up to 6 cm at this level (image 59, series 2). There is stasis of enteric contents with fecalization of this stool in the small bowel. The more distal small bowel is collapsed. Colon is relatively collapsed. No free fluid.  No pneumatosis. Vascular/Lymphatic: Abdominal aorta is normal caliber. There is no retroperitoneal or periportal lymphadenopathy. No pelvic lymphadenopathy. Reproductive: Post hysterectomy anatomy Other: No free fluid. Musculoskeletal: No aggressive osseous lesion. IMPRESSION: 1. Small bowel obstruction at the level of the distal enteric enteric anastomosis. This obstruction is long standing with the loops of small bowel significantly distended up to 6 cm and stasis of enteric content with fecalization of stool. Obstruction appears fairly high-grade at this point. 2. More proximal gastrojejunostomy is patent. Electronically Signed   By: StSuzy Bouchard.D.   On: 03/19/2015 23:38   Dg Abd Acute W/chest  03/19/2015  CLINICAL DATA:  Burning sensation abdomen. EXAM: DG ABDOMEN ACUTE W/ 1V CHEST COMPARISON:  None. FINDINGS: Lungs are clear.  No free air beneath hemidiaphragm. No dilated loops of large or small bowel. Stool in the RIGHT colon. Gas in the rectum. No pathologic calcifications. No organomegaly. Postcholecystectomy. IMPRESSION: 1.  No acute cardiopulmonary process. 2. No bowel obstruction or  intraperitoneal free air. Electronically Signed   By: StSuzy Bouchard.D.   On: 03/19/2015 21:30    Review of Systems  Constitutional: Positive for weight loss.  HENT: Negative.   Eyes: Negative.   Respiratory: Negative.   Cardiovascular: Negative.   Gastrointestinal: Positive for nausea, vomiting and abdominal pain.  Genitourinary: Negative.   Musculoskeletal: Negative.   Skin: Negative.   Neurological: Negative.   Endo/Heme/Allergies: Negative.   Psychiatric/Behavioral: Negative.    Blood pressure 116/78, pulse 97, temperature 98 F (36.7 C), temperature source Oral, resp. rate 14, SpO2 99 %. Physical Exam  Constitutional: She is oriented to person, place, and time. She appears well-developed and well-nourished.  HENT:  Head: Normocephalic and atraumatic.  Eyes: Conjunctivae and EOM are normal. Pupils are equal, round, and reactive to light.  Neck: Normal range of motion. Neck supple.  Cardiovascular: Normal rate, regular rhythm and normal heart sounds.   Respiratory: Effort normal and breath sounds normal.  GI: Soft.  There is some mild tenderness on right side of abdomen. Well healed upper midline scar  Musculoskeletal: Normal range of motion.  Neurological: She is alert and oriented to person, place, and time.  Skin: Skin is warm and dry.  Psychiatric: She has a normal mood and affect. Her behavior is normal.    Assessment/Plan:  The patient appears to have at least a partial bowel obstruction possibly related to her previous gastric bypass surgery. At this point I would agree with bowel rest and fluid resuscitation. If she does not improve over the next couple days she may require exploration.  We will follow her closely with you.  TOTH III,Tymia Streb S 03/20/2015, 6:27 AM

## 2015-03-20 NOTE — Progress Notes (Signed)
[facility] PROGRESS NOTE  Janice Santos ZOX:[id number] DOB: 06/20/1965 DOA: 03/19/2015 PCP: No primary care provider on file.  Brief narrative 50 year old female with history of gastric bypass, partial hysterectomy and cholecystectomy, hypertension, diabetes mellitus, GERD, depression, migraine, PTSD who presented with abdominal pain with nausea and vomiting for the past 5 days mainly located on the right side of the abdomen. She had 5 episodes of vomiting on the day prior to admission. In the ED CT of the abdomen and pelvis showed high-grade small bowel obstruction. Surgery consulted and patient admitted to hospitalist service.  Assessment/Plan: Partial small bowel obstruction Likely due to lesions from prior surgeries. Continue nothing by mouth, NG to wall suction, pain control and antiemetics. Surgery following. He will abdominal exam. Follow-up abdominal x-ray in the morning.  Type 2 diabetes mellitus Takes metformin at home which is held. Monitor on sliding scale coverage.  GERD Continue IV Protonix  Essential hypertension When necessary IV hydralazine  Major depressive disorder History of PTSD. Holding home medications as patient is nothing by mouth.  History of alcohol Abuse Reports quitting in 06/2014.   Diet: Nothing by mouth  DVT prophylaxis  Code Status: Full code Family Communication: None at bedside Disposition Plan: Admit to medical floor   Consultants:  [facility]  Procedures:  CT abdomen and pelvis  Antibiotics:  None  HPI/Subjective: Seen and examined. Complains of discomfort with the NG. No nausea. Right abdominal pain  Objective: Filed Vitals:   03/20/15 1145 03/20/15 1230  BP: 122/92 102/66  Pulse: 87 95  Temp:    Resp: 11 17   No intake or output data in the 24 hours ending 03/20/15 1255 There were no vitals filed for this visit.  Exam:   General:  Middle aged obese female in some distress  HEENT: Dry mucosa, NG  placed on admission  Chest: Clear bilaterally  CVS: Normal S1 and S2, no murmurs  She had: Soft, right mid abdominal tenderness, nondistended, bowel sounds present  Musculoskeletal: Warm, no edema  Data Reviewed: Basic Metabolic Panel:  Recent Labs Lab 03/19/15 1609 03/20/15 0508  NA 140 142  K 4.2 3.9  CL 109 110  CO2 20* 22  GLUCOSE 145* 126*  BUN 22* 23*  CREATININE 0.92 0.96  CALCIUM 9.7 9.1   Liver Function Tests:  Recent Labs Lab 03/19/15 1609  AST 43*  ALT 30  ALKPHOS 111  BILITOT 0.7  PROT 7.6  ALBUMIN 4.5    Recent Labs Lab 03/19/15 1609  LIPASE 15   No results for input(s): AMMONIA in the last 168 hours. CBC:  Recent Labs Lab 03/19/15 1609 03/20/15 0508  WBC 9.2 7.8  HGB 13.4 12.3  HCT 40.2 37.7  MCV 88.7 89.1  PLT 296 260   Cardiac Enzymes: No results for input(s): CKTOTAL, CKMB, CKMBINDEX, TROPONINI in the last 168 hours. BNP (last 3 results) No results for input(s): BNP in the last 8760 hours.  ProBNP (last 3 results) No results for input(s): PROBNP in the last 8760 hours.  CBG:  Recent Labs Lab 03/20/15 0800 03/20/15 1136  GLUCAP 119* 93    No results found for this or any previous visit (from the past 240 hour(s)).   Studies: Ct Abdomen Pelvis W Contrast  03/19/2015  CLINICAL DATA:  Burning sensation the abdomen radiates the back. Pain for 3 days EXAM: CT ABDOMEN AND PELVIS WITH CONTRAST TECHNIQUE: Multidetector CT imaging of the abdomen and pelvis was performed using the standard protocol following bolus administration of  intravenous contrast. CONTRAST:  25mL OMNIPAQUE IOHEXOL 300 MG/ML SOLN, 100mL OMNIPAQUE IOHEXOL 300 MG/ML SOLN COMPARISON:  None. FINDINGS: Lower chest: Lung bases are clear. Hepatobiliary: No focal hepatic lesion. Postcholecystectomy. No biliary dilatation. Pancreas: Pancreas is normal. No ductal dilatation. No pancreatic inflammation. Spleen: Normal spleen Adrenals/urinary tract: Adrenal glands and  kidneys are normal. The ureters and bladder normal. Stomach/Bowel: status post gastric bypass surgery. Contrast flows through the gastrojejunostomy into the efferent limb. There is stasis of enteric contents of this limb leading up to the enteric enteric anastomosis in the lower mid abdomen. Loops of bowel measure up to 6 cm at this level (image 59, series 2). There is stasis of enteric contents with fecalization of this stool in the small bowel. The more distal small bowel is collapsed. Colon is relatively collapsed. No free fluid.  No pneumatosis. Vascular/Lymphatic: Abdominal aorta is normal caliber. There is no retroperitoneal or periportal lymphadenopathy. No pelvic lymphadenopathy. Reproductive: Post hysterectomy anatomy Other: No free fluid. Musculoskeletal: No aggressive osseous lesion. IMPRESSION: 1. Small bowel obstruction at the level of the distal enteric enteric anastomosis. This obstruction is long standing with the loops of small bowel significantly distended up to 6 cm and stasis of enteric content with fecalization of stool. Obstruction appears fairly high-grade at this point. 2. More proximal gastrojejunostomy is patent. Electronically Signed   By: Genevive BiStewart  Edmunds M.D.   On: 03/19/2015 23:38   Dg Abd Acute W/chest  03/19/2015  CLINICAL DATA:  Burning sensation abdomen. EXAM: DG ABDOMEN ACUTE W/ 1V CHEST COMPARISON:  None. FINDINGS: Lungs are clear.  No free air beneath hemidiaphragm. No dilated loops of large or small bowel. Stool in the RIGHT colon. Gas in the rectum. No pathologic calcifications. No organomegaly. Postcholecystectomy. IMPRESSION: 1.  No acute cardiopulmonary process. 2. No bowel obstruction or intraperitoneal free air. Electronically Signed   By: Genevive BiStewart  Edmunds M.D.   On: 03/19/2015 21:30    Scheduled Meds: . acyclovir  400 mg Oral BID  . amitriptyline  50 mg Oral QHS  . aspirin EC  81 mg Oral Daily  . cholecalciferol  5,000 Units Oral Daily  . FLUoxetine  40 mg Oral  Daily  . insulin aspart  0-9 Units Subcutaneous TID WC  . lisinopril  5 mg Oral Daily  . multivitamin with minerals  1 tablet Oral Daily  . pantoprazole (PROTONIX) IV  40 mg Intravenous Q24H  . sodium chloride  3 mL Intravenous Q12H  . topiramate  100 mg Oral BID  . vitamin B-12  4,000 mcg Oral Daily   Continuous Infusions:     Time spent: 20 MINUTES    Ceciley Buist  [facility] Pager [phone]. If 7PM-7AM, please contact night-coverage at [email or web address], password [email or web address] 03/20/2015, 12:55 PM  LOS: 0 days

## 2015-03-21 ENCOUNTER — Inpatient Hospital Stay (HOSPITAL_COMMUNITY): Payer: BLUE CROSS/BLUE SHIELD

## 2015-03-21 LAB — GLUCOSE, CAPILLARY
GLUCOSE-CAPILLARY: 77 mg/dL (ref 65–99)
Glucose-Capillary: 109 mg/dL — ABNORMAL HIGH (ref 65–99)
Glucose-Capillary: 88 mg/dL (ref 65–99)
Glucose-Capillary: 97 mg/dL (ref 65–99)

## 2015-03-21 MED ORDER — PNEUMOCOCCAL VAC POLYVALENT 25 MCG/0.5ML IJ INJ
0.5000 mL | INJECTION | INTRAMUSCULAR | Status: DC
  Filled 2015-03-21 (×3): qty 0.5

## 2015-03-21 NOTE — Progress Notes (Signed)
Utilization review completed.  

## 2015-03-21 NOTE — Progress Notes (Signed)
[facility] PROGRESS NOTE  Janice Santos ZOX:[id number] DOB: 12/23/1965 DOA: 03/19/2015 PCP: No primary care provider on file.  Brief narrative 50 year old female with history of gastric bypass, partial hysterectomy and cholecystectomy, hypertension, diabetes mellitus, GERD, depression, migraine, PTSD who presented with abdominal pain with nausea and vomiting for the past 5 days mainly located on the right side of the abdomen. She had 5 episodes of vomiting on the day prior to admission. In the ED CT of the abdomen and pelvis showed high-grade small bowel obstruction. Surgery consulted and patient admitted to hospitalist service.  Assessment/Plan: Partial small bowel obstruction Likely due to lesions from prior surgeries. Continue nothing by mouth, NG , pain control and antiemetics. Surgery following. . Follow-up abdominal x-ray this morning shows nonobstructive bowel gas pattern. Discussed the surgery recommend to continue current management she has high likelihood of having persistent bowel obstruction.  Type 2 diabetes mellitus  Monitor on sliding scale coverage. Metformin held.  GERD Continue IV Protonix  Essential hypertension When necessary IV hydralazine  Major depressive disorder History of PTSD. Holding home medications as patient is nothing by mouth.  History of alcohol Abuse Reports quitting in 06/2014.   Diet: Nothing by mouth  DVT prophylaxis  Code Status: Full code Family Communication: None at bedside Disposition Plan: Continue inpatient monitoring.   Consultants:  [facility] [facility]  Procedures:  CT abdomen and pelvis  Antibiotics:  None  HPI/Subjective: Seen and examined. Kidney is to have a right mid quadrant abdominal pain. No nausea.  Objective: Filed Vitals:   03/20/15 2119 03/21/15 0543  BP: 128/77 138/81  Pulse: 102 109  Temp: 98.9 F (37.2 C) 99.3 F (37.4 C)  Resp: 16 12    Intake/Output Summary (Last 24 hours) at 03/21/15  1242 Last data filed at 03/21/15 [id number]  Gross per 24 hour  Intake      0 ml  Output    425 ml  Net   -425 ml   Filed Weights   03/21/15 0607  Weight: 109.3 kg (240 lb 15.4 oz)    Exam:   General:  Obtained distress  HEENT: Dry mucosa, NG +  Chest: Clear bilaterally  CVS: Normal S1 and S2, no murmurs  She had: Soft, right mid abdominal tenderness, nondistended, bowel sounds sluggish  Musculoskeletal: Warm, no edema  Data Reviewed: Basic Metabolic Panel:  Recent Labs Lab 03/19/15 1609 03/20/15 0508  NA 140 142  K 4.2 3.9  CL 109 110  CO2 20* 22  GLUCOSE 145* 126*  BUN 22* 23*  CREATININE 0.92 0.96  CALCIUM 9.7 9.1   Liver Function Tests:  Recent Labs Lab 03/19/15 1609  AST 43*  ALT 30  ALKPHOS 111  BILITOT 0.7  PROT 7.6  ALBUMIN 4.5    Recent Labs Lab 03/19/15 1609  LIPASE 15   No results for input(s): AMMONIA in the last 168 hours. CBC:  Recent Labs Lab 03/19/15 1609 03/20/15 0508  WBC 9.2 7.8  HGB 13.4 12.3  HCT 40.2 37.7  MCV 88.7 89.1  PLT 296 260   Cardiac Enzymes: No results for input(s): CKTOTAL, CKMB, CKMBINDEX, TROPONINI in the last 168 hours. BNP (last 3 results) No results for input(s): BNP in the last 8760 hours.  ProBNP (last 3 results) No results for input(s): PROBNP in the last 8760 hours.  CBG:  Recent Labs Lab 03/20/15 1136 03/20/15 1709 03/20/15 2123 03/21/15 0735 03/21/15 1131  GLUCAP 93 103* 120* 109* 88    No results found for this  or any previous visit (from the past 240 hour(s)).   Studies: Ct Abdomen Pelvis W Contrast  03/19/2015  CLINICAL DATA:  Burning sensation the abdomen radiates the back. Pain for 3 days EXAM: CT ABDOMEN AND PELVIS WITH CONTRAST TECHNIQUE: Multidetector CT imaging of the abdomen and pelvis was performed using the standard protocol following bolus administration of intravenous contrast. CONTRAST:  25mL OMNIPAQUE IOHEXOL 300 MG/ML SOLN, OMNIPAQUE IOHEXOL 300 MG/ML SOLN  COMPARISON:  None. FINDINGS: Lower chest: Lung bases are clear. Hepatobiliary: No focal hepatic lesion. Postcholecystectomy. No biliary dilatation. Pancreas: Pancreas is normal. No ductal dilatation. No pancreatic inflammation. Spleen: Normal spleen Adrenals/urinary tract: Adrenal glands and kidneys are normal. The ureters and bladder normal. Stomach/Bowel: status post gastric bypass surgery. Contrast flows through the gastrojejunostomy into the efferent limb. There is stasis of enteric contents of this limb leading up to the enteric enteric anastomosis in the lower mid abdomen. Loops of bowel measure up to 6 cm at this level (image 59, series 2). There is stasis of enteric contents with fecalization of this stool in the small bowel. The more distal small bowel is collapsed. Colon is relatively collapsed. No free fluid.  No pneumatosis. Vascular/Lymphatic: Abdominal aorta is normal caliber. There is no retroperitoneal or periportal lymphadenopathy. No pelvic lymphadenopathy. Reproductive: Post hysterectomy anatomy Other: No free fluid. Musculoskeletal: No aggressive osseous lesion. IMPRESSION: 1. Small bowel obstruction at the level of the distal enteric enteric anastomosis. This obstruction is long standing with the loops of small bowel significantly distended up to 6 cm and stasis of enteric content with fecalization of stool. Obstruction appears fairly high-grade at this point. 2. More proximal gastrojejunostomy is patent. Electronically Signed   By: Genevive Bi M.D.   On: 03/19/2015 23:38   Dg Abd Acute W/chest  03/19/2015  CLINICAL DATA:  Burning sensation abdomen. EXAM: DG ABDOMEN ACUTE W/ 1V CHEST COMPARISON:  None. FINDINGS: Lungs are clear.  No free air beneath hemidiaphragm. No dilated loops of large or small bowel. Stool in the RIGHT colon. Gas in the rectum. No pathologic calcifications. No organomegaly. Postcholecystectomy. IMPRESSION: 1.  No acute cardiopulmonary process. 2. No bowel  obstruction or intraperitoneal free air. Electronically Signed   By: Genevive Bi M.D.   On: 03/19/2015 21:30   Dg Abd Portable 1v  03/21/2015  CLINICAL DATA:  Abdominal pain 1 week. Followup small bowel obstruction. EXAM: PORTABLE ABDOMEN - 1 VIEW COMPARISON:  CT 03/19/2015 and plain films 03/19/2015 FINDINGS: Examination demonstrates a nonobstructive bowel gas pattern with air, contrast and stool throughout the colon. No dilated small bowel loops. Mild degenerative change of the hips. 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 IMPRESSION: Nonobstructive bowel gas pattern. 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 Electronically Signed   By: Elberta Fortis M.D.   On: 03/21/2015 08:29    Scheduled Meds: . acyclovir  400 mg Oral BID  . amitriptyline  50 mg Oral QHS  . aspirin EC  81 mg Oral Daily  . cholecalciferol  5,000 Units Oral Daily  . enoxaparin (LOVENOX) injection  40 mg Subcutaneous Q24H  . FLUoxetine  40 mg Oral Daily  . insulin aspart  0-9 Units Subcutaneous TID WC  . lisinopril  5 mg Oral Daily  . multivitamin with minerals  1 tablet Oral Daily  . pantoprazole (PROTONIX) IV  40 mg Intravenous Q24H  . sodium chloride  3 mL Intravenous Q12H  . topiramate  100 mg Oral BID  . vitamin  B-12  4,000 mcg Oral Daily   Continuous Infusions:     Time spent: 25  MINUTES    Eddie North  [facility] Pager [phone]. If 7PM-7AM, please contact night-coverage at [email or web address], password [facility] 03/21/2015, 12:42 PM  LOS: 1 day

## 2015-03-21 NOTE — Progress Notes (Signed)
Subjective: Complains of abd soreness  Objective: Vital signs in last 24 hours: Temp:  [98.7 F (37.1 C)-99.3 F (37.4 C)] 99.3 F (37.4 C) (01/15 0543) Pulse Rate:  [87-109] 109 (01/15 0543) Resp:  [11-17] 12 (01/15 0543) BP: (102-138)/(66-92) 138/81 mmHg (01/15 0543) SpO2:  [93 %-100 %] 99 % (01/15 0543) Weight:  [109.3 kg (240 lb 15.4 oz)] 109.3 kg (240 lb 15.4 oz) (01/15 0607) Last BM Date: 03/18/15  Intake/Output from previous day: 01/14 0701 - 01/15 0700 In: 0  Out: 175 [Emesis/NG output:175] Intake/Output this shift: Total I/O In: -  Out: 250 [Urine:250]  Resp: clear to auscultation bilaterally Cardio: regular rate and rhythm GI: soft, mild tenderness on right side  Lab Results:   Recent Labs  03/19/15 1609 03/20/15 0508  WBC 9.2 7.8  HGB 13.4 12.3  HCT 40.2 37.7  PLT 296 260   BMET  Recent Labs  03/19/15 1609 03/20/15 0508  NA 140 142  K 4.2 3.9  CL 109 110  CO2 20* 22  GLUCOSE 145* 126*  BUN 22* 23*  CREATININE 0.92 0.96  CALCIUM 9.7 9.1   PT/INR  Recent Labs  03/20/15 0250  LABPROT 14.1  INR 1.07   ABG No results for input(s): PHART, HCO3 in the last 72 hours.  Invalid input(s): PCO2, PO2  Studies/Results: Ct Abdomen Pelvis W Contrast  03/19/2015  CLINICAL DATA:  Burning sensation the abdomen radiates the back. Pain for 3 days EXAM: CT ABDOMEN AND PELVIS WITH CONTRAST TECHNIQUE: Multidetector CT imaging of the abdomen and pelvis was performed using the standard protocol following bolus administration of intravenous contrast. CONTRAST:  25mL OMNIPAQUE IOHEXOL 300 MG/ML SOLN, 100mL OMNIPAQUE IOHEXOL 300 MG/ML SOLN COMPARISON:  None. FINDINGS: Lower chest: Lung bases are clear. Hepatobiliary: No focal hepatic lesion. Postcholecystectomy. No biliary dilatation. Pancreas: Pancreas is normal. No ductal dilatation. No pancreatic inflammation. Spleen: Normal spleen Adrenals/urinary tract: Adrenal glands and kidneys are normal. The ureters  and bladder normal. Stomach/Bowel: status post gastric bypass surgery. Contrast flows through the gastrojejunostomy into the efferent limb. There is stasis of enteric contents of this limb leading up to the enteric enteric anastomosis in the lower mid abdomen. Loops of bowel measure up to 6 cm at this level (image 59, series 2). There is stasis of enteric contents with fecalization of this stool in the small bowel. The more distal small bowel is collapsed. Colon is relatively collapsed. No free fluid.  No pneumatosis. Vascular/Lymphatic: Abdominal aorta is normal caliber. There is no retroperitoneal or periportal lymphadenopathy. No pelvic lymphadenopathy. Reproductive: Post hysterectomy anatomy Other: No free fluid. Musculoskeletal: No aggressive osseous lesion. IMPRESSION: 1. Small bowel obstruction at the level of the distal enteric enteric anastomosis. This obstruction is long standing with the loops of small bowel significantly distended up to 6 cm and stasis of enteric content with fecalization of stool. Obstruction appears fairly high-grade at this point. 2. More proximal gastrojejunostomy is patent. Electronically Signed   By: Genevive BiStewart  Edmunds M.D.   On: 03/19/2015 23:38   Dg Abd Acute W/chest  03/19/2015  CLINICAL DATA:  Burning sensation abdomen. EXAM: DG ABDOMEN ACUTE W/ 1V CHEST COMPARISON:  None. FINDINGS: Lungs are clear.  No free air beneath hemidiaphragm. No dilated loops of large or small bowel. Stool in the RIGHT colon. Gas in the rectum. No pathologic calcifications. No organomegaly. Postcholecystectomy. IMPRESSION: 1.  No acute cardiopulmonary process. 2. No bowel obstruction or intraperitoneal free air. Electronically Signed   By: Loura HaltStewart  Edmunds M.D.  On: 03/19/2015 21:30   Dg Abd Portable 1v  03/21/2015  CLINICAL DATA:  Abdominal pain 1 week. Followup small bowel obstruction. EXAM: PORTABLE ABDOMEN - 1 VIEW COMPARISON:  CT 03/19/2015 and plain films 03/19/2015 FINDINGS: Examination  demonstrates a nonobstructive bowel gas pattern with air, contrast and stool throughout the colon. No dilated small bowel loops. Mild degenerative change of the hips. 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 IMPRESSION: Nonobstructive bowel gas pattern. 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 Electronically Signed   By: Elberta Fortis M.D.   On: 03/21/2015 08:29    Anti-infectives: Anti-infectives    Start     Dose/Rate Route Frequency Ordered Stop   03/20/15 1000  acyclovir (ZOVIRAX) tablet 400 mg     400 mg Oral 2 times daily 03/20/15 0117        Assessment/Plan: s/p * No surgery found * abd xrays improved.  Continue ng and bowel rest  Repeat abd xrays tomorrow  LOS: 1 day    TOTH III,PAUL S 03/21/2015

## 2015-03-22 ENCOUNTER — Inpatient Hospital Stay (HOSPITAL_COMMUNITY): Payer: BLUE CROSS/BLUE SHIELD

## 2015-03-22 DIAGNOSIS — R1033 Periumbilical pain: Secondary | ICD-10-CM

## 2015-03-22 LAB — GLUCOSE, CAPILLARY
GLUCOSE-CAPILLARY: 73 mg/dL (ref 65–99)
Glucose-Capillary: 107 mg/dL — ABNORMAL HIGH (ref 65–99)
Glucose-Capillary: 141 mg/dL — ABNORMAL HIGH (ref 65–99)
Glucose-Capillary: 136 mg/dL — ABNORMAL HIGH (ref 65–99)

## 2015-03-22 LAB — CBC
HCT: 35.6 % — ABNORMAL LOW (ref 36.0–46.0)
Hemoglobin: 11.4 g/dL — ABNORMAL LOW (ref 12.0–15.0)
MCH: 29.2 pg (ref 26.0–34.0)
MCHC: 32 g/dL (ref 30.0–36.0)
MCV: 91 fL (ref 78.0–100.0)
PLATELETS: 238 10*3/uL (ref 150–400)
RBC: 3.91 MIL/uL (ref 3.87–5.11)
RDW: 13.1 % (ref 11.5–15.5)
WBC: 5 10*3/uL (ref 4.0–10.5)

## 2015-03-22 LAB — HEMOGLOBIN A1C
HEMOGLOBIN A1C: 6.2 % — AB (ref 4.8–5.6)
Mean Plasma Glucose: 131 mg/dL

## 2015-03-22 LAB — BASIC METABOLIC PANEL
ANION GAP: 11 (ref 5–15)
BUN: 21 mg/dL — ABNORMAL HIGH (ref 6–20)
CALCIUM: 8.7 mg/dL — AB (ref 8.9–10.3)
CO2: 24 mmol/L (ref 22–32)
CREATININE: 0.71 mg/dL (ref 0.44–1.00)
Chloride: 106 mmol/L (ref 101–111)
GFR calc Af Amer: >60 mL/min (ref >60)
GFR calc non Af Amer: >60 mL/min (ref >60)
Glucose, Bld: 83 mg/dL (ref 65–99)
Potassium: 3.5 mmol/L (ref 3.5–5.1)
Sodium: 141 mmol/L (ref 135–145)

## 2015-03-22 MED ORDER — DEXTROSE-NACL 5-0.45 % IV SOLN
INTRAVENOUS | Status: DC
  Administered 2015-03-22: 08:00:00 via INTRAVENOUS

## 2015-03-22 MED ORDER — ONDANSETRON HCL 4 MG/2ML IJ SOLN
4.0000 mg | Freq: Once | INTRAMUSCULAR | Status: DC
  Filled 2015-03-22: qty 2

## 2015-03-22 MED ORDER — SODIUM CHLORIDE 0.9 % IV SOLN
8.0000 mg | Freq: Once | INTRAVENOUS | Status: DC
  Filled 2015-03-22: qty 4

## 2015-03-22 MED ORDER — HYDROMORPHONE HCL 1 MG/ML IJ SOLN
1.0000 mg | Freq: Once | INTRAMUSCULAR | Status: AC
  Administered 2015-03-22: 1 mg via INTRAVENOUS

## 2015-03-22 MED ORDER — ONDANSETRON HCL 4 MG/2ML IJ SOLN: 4.0000 mg | Freq: Once | INTRAMUSCULAR | Status: DC

## 2015-03-22 MED ORDER — POTASSIUM CHLORIDE 2 MEQ/ML IV SOLN
INTRAVENOUS | Status: DC
  Administered 2015-03-22 – 2015-03-25 (×8): via INTRAVENOUS
  Administered 2015-03-26: 90 mL via INTRAVENOUS
  Administered 2015-03-26: 08:00:00 via INTRAVENOUS
  Filled 2015-03-22 (×22): qty 1000

## 2015-03-22 MED ORDER — POTASSIUM CHLORIDE 2 MEQ/ML IV SOLN
INTRAVENOUS | Status: DC
  Filled 2015-03-22: qty 1000

## 2015-03-22 MED ORDER — PANTOPRAZOLE SODIUM 40 MG IV SOLR
40.0000 mg | Freq: Two times a day (BID) | INTRAVENOUS | Status: DC
  Administered 2015-03-22 – 2015-03-26 (×9): 40 mg via INTRAVENOUS
  Filled 2015-03-22 (×10): qty 40

## 2015-03-22 MED ORDER — ONDANSETRON HCL 4 MG/2ML IJ SOLN
INTRAMUSCULAR | Status: AC
  Administered 2015-03-22: 4 mg
  Filled 2015-03-22: qty 2

## 2015-03-22 MED ORDER — MILK AND MOLASSES ENEMA
1.0000 | Freq: Two times a day (BID) | RECTAL | Status: DC
  Administered 2015-03-22 – 2015-03-24 (×2): 250 mL via RECTAL
  Filled 2015-03-22 (×10): qty 250

## 2015-03-22 NOTE — Progress Notes (Addendum)
General Surgery:  History reviewed.  [place] [place] [facility] [place]  Operative details unknown.  Her CT scan over the weekend showed lots of food and air in the proximal small bowel.  Obstruction at the jejunojejunostomy was suggested. Lab work today shows WBC 5000.  Potassium 3.5.  Creatinine 0.71.  She is still having some pain.  NG drainage volumes are relatively  low. Abdomen is soft but is tender more in the lower abdomen the upper abdomen. No peritoneal signs.  Two-way abdominal x-rays today show lots of gas and stool in the colon and only slightly prominent small bowel loops in the mid abdomen.  It is not clear whether she has a mechanical obstruction or whether this is just a food impaction that will break up over time.  There is superimposed constipation radiographically.  there is no sign of any ischemic bowel and this may be a chronic process We have repositioned her NG tube Her potassium is relatively low at 3.5 and I have adjusted her IV fluids. Going to give her 2 large enemas I've asked Dr. Daphine DeutscherMartin, one of our bariatric surgeons to take a look at the situation later today.    Angelia MouldHaywood M. Derrell LollingIngram, M.D., [facility] General and Minimally invasive Surgery Breast and Colorectal Surgery

## 2015-03-22 NOTE — Progress Notes (Signed)
[facility] PROGRESS NOTE  Janice Selleronya Santos QIH:[id number] DOB: 06/15/1965 DOA: 03/19/2015 PCP: No primary care provider on file.  Brief narrative 50 year old female with history of gastric bypass, partial hysterectomy and cholecystectomy, hypertension, diabetes mellitus, GERD, depression, migraine, PTSD who presented with abdominal pain with nausea and vomiting for the past 5 days mainly located on the right side of the abdomen. She had 5 episodes of vomiting on the day prior to admission. In the ED CT of the abdomen and pelvis showed high-grade small bowel obstruction. Surgery consulted and patient admitted to hospitalist service.  Assessment/Plan: Partial small bowel obstruction No obvious obstruction seen on follow-up x-ray although patient still has pain. No NG output.. Surgery following. I have ordered enemas for today and Dr. Daphine DeutscherMartin will review the images today. -Continue nothing by mouth with IV fluids for now.  Type 2 diabetes mellitus  Monitor on sliding scale coverage. Metformin held. Added D5 for low blood glucose.  GERD Continue IV Protonix  Essential hypertension When necessary IV hydralazine  Major depressive disorder History of PTSD. Continue home medications.  History of alcohol Abuse Reports quitting in 06/2014.   Diet: Nothing by mouth  DVT prophylaxis  Code Status: Full code Family Communication: None at bedside Disposition Plan: Continue inpatient monitoring.   Consultants:  [facility]  Procedures:  CT abdomen and pelvis  Antibiotics:  None  HPI/Subjective: Still has right mid quadrant abdominal pain. Low  NG output.  Objective: Filed Vitals:   03/21/15 2153 03/22/15 0415  BP: 120/81 143/89  Pulse: 75 82  Temp: 98.6 F (37 C) 98.6 F (37 C)  Resp: 16 16    Intake/Output Summary (Last 24 hours) at 03/22/15 1204 Last data filed at 03/22/15 0700  Gross per 24 hour  Intake    505 ml  Output   1225 ml  Net   -720 ml    Filed Weights   03/21/15 0607  Weight: 109.3 kg (240 lb 15.4 oz)    Exam:   General:  Not in distress  HEENT: Dry mucosa, NG +  Chest: Clear bilaterally  CVS: Normal S1 and S2, no murmurs  She had: Soft, right mid abdominal tenderness, nondistended, bowel sounds present  Musculoskeletal: Warm, no edema  Data Reviewed: Basic Metabolic Panel:  Recent Labs Lab 03/19/15 1609 03/20/15 0508 03/22/15 0430  NA 140 142 141  K 4.2 3.9 3.5  CL 109 110 106  CO2 20* 22 24  GLUCOSE 145* 126* 83  BUN 22* 23* 21*  CREATININE 0.92 0.96 0.71  CALCIUM 9.7 9.1 8.7*   Liver Function Tests:  Recent Labs Lab 03/19/15 1609  AST 43*  ALT 30  ALKPHOS 111  BILITOT 0.7  PROT 7.6  ALBUMIN 4.5    Recent Labs Lab 03/19/15 1609  LIPASE 15   No results for input(s): AMMONIA in the last 168 hours. CBC:  Recent Labs Lab 03/19/15 1609 03/20/15 0508 03/22/15 0430  WBC 9.2 7.8 5.0  HGB 13.4 12.3 11.4*  HCT 40.2 37.7 35.6*  MCV 88.7 89.1 91.0  PLT 296 260 238   Cardiac Enzymes: No results for input(s): CKTOTAL, CKMB, CKMBINDEX, TROPONINI in the last 168 hours. BNP (last 3 results) No results for input(s): BNP in the last 8760 hours.  ProBNP (last 3 results) No results for input(s): PROBNP in the last 8760 hours.  CBG:  Recent Labs Lab 03/21/15 1131 03/21/15 1655 03/21/15 2150 03/22/15 0738 03/22/15 1145  GLUCAP 88 77 97 73 107*    No  results found for this or any previous visit (from the past 240 hour(s)).   Studies: Dg Abd 2 Views  03/22/2015  CLINICAL DATA:  Lower abdominal pain, nausea. History of small bowel obstruction. EXAM: ABDOMEN - 2 VIEW COMPARISON:  Plain films 03/21/2015.  CT 03/19/2015. FINDINGS: NG tube tip is in the distal esophagus. Prior cholecystectomy. Gas and stool within the colon. Mildly prominent mid abdominal small bowel loops without convincing evidence for bowel obstruction. No free air organomegaly. IMPRESSION: Slight prominence of  mid abdominal small bowel loops. Gas and stool noted within the colon. No convincing evidence for small bowel obstruction. NG tube tip in the distal esophagus appear Electronically Signed   By: Charlett Nose M.D.   On: 03/22/2015 09:13   Dg Abd Portable 1v  03/21/2015  CLINICAL DATA:  Abdominal pain 1 week. Followup small bowel obstruction. EXAM: PORTABLE ABDOMEN - 1 VIEW COMPARISON:  CT 03/19/2015 and plain films 03/19/2015 FINDINGS: Examination demonstrates a nonobstructive bowel gas pattern with air, contrast and stool throughout the colon. No dilated small bowel loops. Mild degenerative change of the hips. 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 IMPRESSION: Nonobstructive bowel gas pattern. 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 Electronically Signed   By: Elberta Fortis M.D.   On: 03/21/2015 08:29    Scheduled Meds: . acyclovir  400 mg Oral BID  . amitriptyline  50 mg Oral QHS  . aspirin EC  81 mg Oral Daily  . cholecalciferol  5,000 Units Oral Daily  . enoxaparin (LOVENOX) injection  40 mg Subcutaneous Q24H  . FLUoxetine  40 mg Oral Daily  . insulin aspart  0-9 Units Subcutaneous TID WC  . lisinopril  5 mg Oral Daily  . milk and molasses  1 enema Rectal BID  . multivitamin with minerals  1 tablet Oral Daily  . ondansetron (ZOFRAN) IV  4 mg Intravenous Once   Or  . ondansetron (ZOFRAN) IV  8 mg Intravenous Once  . pantoprazole (PROTONIX) IV  40 mg Intravenous Q12H  . pneumococcal 23 valent vaccine  0.5 mL Intramuscular Tomorrow-1000  . sodium chloride  3 mL Intravenous Q12H  . topiramate  100 mg Oral BID  . vitamin B-12  4,000 mcg Oral Daily   Continuous Infusions: . lactated ringers 1,000 mL with potassium chloride 40 mEq infusion        Time spent: 25  MINUTES    Cheril Slattery  [facility] Pager [phone]. If 7PM-7AM, please contact night-coverage at [email or web address], password [facility] 03/22/2015, 12:04 PM  LOS: 2 days

## 2015-03-22 NOTE — Progress Notes (Signed)
With a.m. Assessment NG tube placement verification required reinsertion of NG tube to obtain clear placement verification.  Will continue to monitor, Janice Santos A

## 2015-03-23 ENCOUNTER — Inpatient Hospital Stay (HOSPITAL_COMMUNITY): Payer: BLUE CROSS/BLUE SHIELD

## 2015-03-23 ENCOUNTER — Encounter (HOSPITAL_COMMUNITY): Payer: Self-pay | Admitting: Radiology

## 2015-03-23 LAB — BASIC METABOLIC PANEL
ANION GAP: 6 (ref 5–15)
BUN: 11 mg/dL (ref 6–20)
CHLORIDE: 111 mmol/L (ref 101–111)
CO2: 25 mmol/L (ref 22–32)
Calcium: 9.2 mg/dL (ref 8.9–10.3)
Creatinine, Ser: 0.68 mg/dL (ref 0.44–1.00)
GFR calc non Af Amer: >60 mL/min (ref >60)
Glucose, Bld: 162 mg/dL — ABNORMAL HIGH (ref 65–99)
POTASSIUM: 4.2 mmol/L (ref 3.5–5.1)
Sodium: 142 mmol/L (ref 135–145)

## 2015-03-23 LAB — GLUCOSE, CAPILLARY
Glucose-Capillary: 144 mg/dL — ABNORMAL HIGH (ref 65–99)
Glucose-Capillary: 121 mg/dL — ABNORMAL HIGH (ref 65–99)
Glucose-Capillary: 142 mg/dL — ABNORMAL HIGH (ref 65–99)
Glucose-Capillary: 113 mg/dL — ABNORMAL HIGH (ref 65–99)

## 2015-03-23 LAB — CBC
HEMATOCRIT: 35.9 % — AB (ref 36.0–46.0)
HEMOGLOBIN: 11.4 g/dL — AB (ref 12.0–15.0)
MCH: 28.8 pg (ref 26.0–34.0)
MCHC: 31.8 g/dL (ref 30.0–36.0)
MCV: 90.7 fL (ref 78.0–100.0)
Platelets: 226 10*3/uL (ref 150–400)
RBC: 3.96 MIL/uL (ref 3.87–5.11)
RDW: 12.9 % (ref 11.5–15.5)
WBC: 5.4 10*3/uL (ref 4.0–10.5)

## 2015-03-23 MED ORDER — IOHEXOL 300 MG/ML  SOLN
25.0000 mL | INTRAMUSCULAR | Status: AC
  Administered 2015-03-23 (×2): 25 mL via ORAL

## 2015-03-23 MED ORDER — INSULIN ASPART 100 UNIT/ML ~~LOC~~ SOLN
0.0000 [IU] | Freq: Three times a day (TID) | SUBCUTANEOUS | Status: DC
  Administered 2015-03-23 – 2015-03-24 (×2): 2 [IU] via SUBCUTANEOUS
  Administered 2015-03-24: 3 [IU] via SUBCUTANEOUS
  Administered 2015-03-25 – 2015-03-26 (×2): 2 [IU] via SUBCUTANEOUS

## 2015-03-23 NOTE — Progress Notes (Signed)
[facility] PROGRESS NOTE  Janice Santos NWG:[id number] DOB: 1965-06-30 DOA: 03/19/2015 PCP: No primary care provider on file.  Brief narrative 50 year old female with history of gastric bypass, partial hysterectomy and cholecystectomy, hypertension, diabetes mellitus, GERD, depression, migraine, PTSD who presented with abdominal pain with nausea and vomiting for the past 5 days mainly located on the right side of the abdomen. She had 5 episodes of vomiting on the day prior to admission. In the ED CT of the abdomen and pelvis showed high-grade small bowel obstruction. Surgery consulted and patient admitted to hospitalist service.  Assessment/Plan: Partial small bowel obstruction No obvious obstruction seen on follow-up x-ray although patient still has pain. No NG output. Did not have any bowel movements with enemas given yesterday. CT of the abdomen and pelvis done was negative for small bowel obstruction. Started clear liquid. Recommend that she may need outpatient endoscopy.   Type 2 diabetes mellitus  Monitor on sliding scale coverage. Metformin held.   GERD Continue PPI  Essential hypertension Continue home medications.  Major depressive disorder History of PTSD. Continue home medications.  History of alcohol Abuse Reports quitting in 06/2014.   Diet: Nothing by mouth  DVT prophylaxis  Code Status: Full code Family Communication: None at bedside Disposition Plan: Continue inpatient monitoring. Home once tolerating advanced diet.   Consultants:  [facility]  Procedures:  CT abdomen and pelvis  Antibiotics:  None  HPI/Subjective: Still complains of right mid quadrant pain. Did not have any bowel movement despite enemas.  Objective: Filed Vitals:   03/22/15 2122 03/23/15 0529  BP: 155/96 142/96  Pulse: 94 98  Temp: 98.2 F (36.8 C) 98.2 F (36.8 C)  Resp:  15    Intake/Output Summary (Last 24 hours) at 03/23/15 1319 Last data filed at  03/23/15 0854  Gross per 24 hour  Intake 2489.59 ml  Output    625 ml  Net 1864.59 ml   Filed Weights   03/21/15 0607  Weight: 109.3 kg (240 lb 15.4 oz)    Exam:   General:  Not in distress  HEENT: Dry mucosa, NG +  Chest: Clear bilaterally  CVS: Normal S1 and S2, no murmurs  She had: Soft, right mid abdominal tenderness, nondistended, bowel sounds present  Musculoskeletal: Warm, no edema  Data Reviewed: Basic Metabolic Panel:  Recent Labs Lab 03/19/15 1609 03/20/15 0508 03/22/15 0430 03/23/15 0440  NA 140 142 141 142  K 4.2 3.9 3.5 4.2  CL 109 110 106 111  CO2 20* GLUCOSE 145* 126* 83 162*  BUN 22* 23* 21* 11  CREATININE 0.92 0.96 0.71 0.68  CALCIUM 9.7 9.1 8.7* 9.2   Liver Function Tests:  Recent Labs Lab 03/19/15 1609  AST 43*  ALT 30  ALKPHOS 111  BILITOT 0.7  PROT 7.6  ALBUMIN 4.5    Recent Labs Lab 03/19/15 1609  LIPASE 15   No results for input(s): AMMONIA in the last 168 hours. CBC:  Recent Labs Lab 03/19/15 1609 03/20/15 0508 03/22/15 0430 03/23/15 0440  WBC 9.2 7.8 5.0 5.4  HGB 13.4 12.3 11.4* 11.4*  HCT 40.2 37.7 35.6* 35.9*  MCV 88.7 89.1 91.0 90.7  PLT 296 260 238 226   Cardiac Enzymes: No results for input(s): CKTOTAL, CKMB, CKMBINDEX, TROPONINI in the last 168 hours. BNP (last 3 results) No results for input(s): BNP in the last 8760 hours.  ProBNP (last 3 results) No results for input(s): PROBNP in the last 8760 hours.  CBG:  Recent Labs Lab 03/22/15 1145 03/22/15 1642 03/22/15 2120 03/23/15 0721 03/23/15 1157  GLUCAP 107* 141* 136* 144* 121*    No results found for this or any previous visit (from the past 240 hour(s)).   Studies: Ct Abdomen Pelvis Wo Contrast  03/23/2015  CLINICAL DATA:  Evaluate for small bowel obstruction. EXAM: CT ABDOMEN AND PELVIS WITHOUT CONTRAST TECHNIQUE: Multidetector CT imaging of the abdomen and pelvis was performed following the standard protocol without IV  contrast. COMPARISON:  Abdomen x-ray March 22, 2015 FINDINGS: The liver, pancreas, spleen and adrenal glands are unremarkable. There is no hydronephrosis bilaterally. There probable 1 mm nonobstructing stones in both kidneys. Patient status post prior cholecystectomy. There is mild atherosclerosis of the aorta. There is no aneurysmal dilatation of aorta. There is no abdominal lymphadenopathy. There is evidence of prior gastric bypass surgery. There is no small bowel obstruction or diverticulitis. The appendix is not seen but no inflammation is noted around cecum. Fluid-filled bladder is normal. Patient status post prior hysterectomy. Mild dependent atelectasis of the posterior lung bases are identified. No acute abnormalities identified within the visualized bones. IMPRESSION: No evidence of small bowel obstruction. No acute abnormality identified in the abdomen and pelvis. Evidence of prior gastric bypass surgery, hysterectomy and cholecystectomy. Electronically Signed   By: Sherian Rein M.D.   On: 03/23/2015 12:44   Dg Abd 2 Views  03/22/2015  CLINICAL DATA:  Lower abdominal pain, nausea. History of small bowel obstruction. EXAM: ABDOMEN - 2 VIEW COMPARISON:  Plain films 03/21/2015.  CT 03/19/2015. FINDINGS: NG tube tip is in the distal esophagus. Prior cholecystectomy. Gas and stool within the colon. Mildly prominent mid abdominal small bowel loops without convincing evidence for bowel obstruction. No free air organomegaly. IMPRESSION: Slight prominence of mid abdominal small bowel loops. Gas and stool noted within the colon. No convincing evidence for small bowel obstruction. NG tube tip in the distal esophagus appear Electronically Signed   By: Charlett Nose M.D.   On: 03/22/2015 09:13    Scheduled Meds: . acyclovir  400 mg Oral BID  . amitriptyline  50 mg Oral QHS  . aspirin EC  81 mg Oral Daily  . cholecalciferol  5,000 Units Oral Daily  . enoxaparin (LOVENOX) injection  40 mg Subcutaneous Q24H   . FLUoxetine  40 mg Oral Daily  . insulin aspart  0-15 Units Subcutaneous TID WC  . lisinopril  5 mg Oral Daily  . milk and molasses  1 enema Rectal BID  . multivitamin with minerals  1 tablet Oral Daily  . ondansetron (ZOFRAN) IV  4 mg Intravenous Once   Or  . ondansetron (ZOFRAN) IV  8 mg Intravenous Once  . pantoprazole (PROTONIX) IV  40 mg Intravenous Q12H  . pneumococcal 23 valent vaccine  0.5 mL Intramuscular Tomorrow-1000  . sodium chloride  3 mL Intravenous Q12H  . topiramate  100 mg Oral BID  . vitamin B-12  4,000 mcg Oral Daily   Continuous Infusions: . dextrose 5 % lactated ringers with kcl 125 mL/hr at 03/23/15 0530      Time spent: 25  MINUTES    Britt Petroni  [facility] Pager [phone]. If 7PM-7AM, please contact night-coverage at [email or web address], password [facility] 03/23/2015, 1:19 PM  LOS: 3 days

## 2015-03-23 NOTE — Progress Notes (Signed)
Patient ID: Janice Santos, female   DOB: 11/24/1965, 50 y.o.   MRN: [id number]     [facility]      [address] [place], [place] [address]    Phone: [phone] FAX: [phone]     Subjective: Pain unchanged, characterized as moving and cramping pain.  No n/v, no flatus.  NGT output mostly clear.   Objective:  Vital signs:  Filed Vitals:   03/22/15 0415 03/22/15 1414 03/22/15 2122 03/23/15 0529  BP: 143/89 115/89 155/96 142/96  Pulse: 82 95 94 98  Temp: 98.6 F (37 C) 99.1 F (37.3 C) 98.2 F (36.8 C) 98.2 F (36.8 C)  TempSrc: Oral Oral Oral Oral  Resp: 16   15  Height:      Weight:      SpO2: 97% 95% 99% 99%    Last BM Date: 03/18/15  Intake/Output   Yesterday:  01/16 0701 - 01/17 0700 In: 2489.6 [I.V.:2489.6] Out: 525 [Emesis/NG output:525] This shift:    Physical Exam: General: Pt awake/alert/oriented x4 in no acute distress  Abdomen: +BS, abdomen is distended, mild generalized ttp without guarding.     Problem List:   Principal Problem:   SBO (small bowel obstruction) (HCC) Active Problems:   Major depressive disorder (HCC)   PTSD (post-traumatic stress disorder)   Diabetes mellitus without complication ([place])   GERD (gastroesophageal reflux disease)   Hypertension    Results:   Labs: Results for orders placed or performed during the hospital encounter of 03/19/15 (from the past 48 hour(s))  Glucose, capillary     Status: None   Collection Time: 03/21/15 11:31 AM  Result Value Ref Range   Glucose-Capillary 88 65 - 99 mg/dL  Glucose, capillary     Status: None   Collection Time: 03/21/15  4:55 PM  Result Value Ref Range   Glucose-Capillary 77 65 - 99 mg/dL  Glucose, capillary     Status: None   Collection Time: 03/21/15  9:50 PM  Result Value Ref Range   Glucose-Capillary 97 65 - 99 mg/dL  Basic metabolic panel     Status: Abnormal   Collection Time: 03/22/15  4:30 AM  Result Value Ref  Range   Sodium 141 135 - 145 mmol/L   Potassium 3.5 3.5 - 5.1 mmol/L   Chloride 106 101 - 111 mmol/L   CO2 24 22 - 32 mmol/L   Glucose, Bld 83 65 - 99 mg/dL   BUN 21 (H) 6 - 20 mg/dL   Creatinine, Ser 0.71 0.44 - 1.00 mg/dL   Calcium 8.7 (L) 8.9 - 10.3 mg/dL   GFR calc non Af Amer >60 >60 mL/min   GFR calc Af Amer >60 >60 mL/min    Comment: (NOTE) The eGFR has been calculated using the CKD EPI equation. This calculation has not been validated in all clinical situations. eGFR's persistently <60 mL/min signify possible Chronic Kidney Disease.    Anion gap 11 5 - 15  CBC     Status: Abnormal   Collection Time: 03/22/15  4:30 AM  Result Value Ref Range   WBC 5.0 4.0 - 10.5 K/uL   RBC 3.91 3.87 - 5.11 MIL/uL   Hemoglobin 11.4 (L) 12.0 - 15.0 g/dL   HCT 35.6 (L) 36.0 - 46.0 %   MCV 91.0 78.0 - 100.0 fL   MCH 29.2 26.0 - 34.0 pg   MCHC 32.0 30.0 - 36.0 g/dL   RDW 13.1 11.5 - 15.5 %  Platelets 238 150 - 400 K/uL  Glucose, capillary     Status: None   Collection Time: 03/22/15  7:38 AM  Result Value Ref Range   Glucose-Capillary 73 65 - 99 mg/dL  Glucose, capillary     Status: Abnormal   Collection Time: 03/22/15 11:45 AM  Result Value Ref Range   Glucose-Capillary 107 (H) 65 - 99 mg/dL  Glucose, capillary     Status: Abnormal   Collection Time: 03/22/15  4:42 PM  Result Value Ref Range   Glucose-Capillary 141 (H) 65 - 99 mg/dL  Glucose, capillary     Status: Abnormal   Collection Time: 03/22/15  9:20 PM  Result Value Ref Range   Glucose-Capillary 136 (H) 65 - 99 mg/dL  CBC     Status: Abnormal   Collection Time: 03/23/15  4:40 AM  Result Value Ref Range   WBC 5.4 4.0 - 10.5 K/uL   RBC 3.96 3.87 - 5.11 MIL/uL   Hemoglobin 11.4 (L) 12.0 - 15.0 g/dL   HCT 90.9 (L) 12.7 - 10.4 %   MCV 90.7 78.0 - 100.0 fL   MCH 28.8 26.0 - 34.0 pg   MCHC 31.8 30.0 - 36.0 g/dL   RDW 14.1 20.5 - 58.7 %   Platelets 226 150 - 400 K/uL  Basic metabolic panel     Status: Abnormal    Collection Time: 03/23/15  4:40 AM  Result Value Ref Range   Sodium 142 135 - 145 mmol/L   Potassium 4.2 3.5 - 5.1 mmol/L    Comment: REPEATED TO VERIFY   Chloride 111 101 - 111 mmol/L   CO2 25 22 - 32 mmol/L   Glucose, Bld 162 (H) 65 - 99 mg/dL   BUN 11 6 - 20 mg/dL   Creatinine, Ser 9.78 0.44 - 1.00 mg/dL   Calcium 9.2 8.9 - 49.1 mg/dL   GFR calc non Af Amer >60 >60 mL/min   GFR calc Af Amer >60 >60 mL/min    Comment: (NOTE) The eGFR has been calculated using the CKD EPI equation. This calculation has not been validated in all clinical situations. eGFR's persistently <60 mL/min signify possible Chronic Kidney Disease.    Anion gap 6 5 - 15    Imaging / Studies: Dg Abd 2 Views  03/22/2015  CLINICAL DATA:  Lower abdominal pain, nausea. History of small bowel obstruction. EXAM: ABDOMEN - 2 VIEW COMPARISON:  Plain films 03/21/2015.  CT 03/19/2015. FINDINGS: NG tube tip is in the distal esophagus. Prior cholecystectomy. Gas and stool within the colon. Mildly prominent mid abdominal small bowel loops without convincing evidence for bowel obstruction. No free air organomegaly. IMPRESSION: Slight prominence of mid abdominal small bowel loops. Gas and stool noted within the colon. No convincing evidence for small bowel obstruction. NG tube tip in the distal esophagus appear Electronically Signed   By: Charlett Nose M.D.   On: 03/22/2015 09:13    Medications / Allergies:  Scheduled Meds: . acyclovir  400 mg Oral BID  . amitriptyline  50 mg Oral QHS  . aspirin EC  81 mg Oral Daily  . cholecalciferol  5,000 Units Oral Daily  . enoxaparin (LOVENOX) injection  40 mg Subcutaneous Q24H  . FLUoxetine  40 mg Oral Daily  . insulin aspart  0-9 Units Subcutaneous TID WC  . iohexol  25 mL Oral Q1 Hr x 2  . lisinopril  5 mg Oral Daily  . milk and molasses  1 enema Rectal BID  .  multivitamin with minerals  1 tablet Oral Daily  . ondansetron (ZOFRAN) IV  4 mg Intravenous Once   Or  . ondansetron  (ZOFRAN) IV  8 mg Intravenous Once  . pantoprazole (PROTONIX) IV  40 mg Intravenous Q12H  . pneumococcal 23 valent vaccine  0.5 mL Intramuscular Tomorrow-1000  . sodium chloride  3 mL Intravenous Q12H  . topiramate  100 mg Oral BID  . vitamin B-12  4,000 mcg Oral Daily   Continuous Infusions: . 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 Hassell Done, recommends CT scan for further characterization.   Continue NGT decompression and bowel rest VTE prophylaxis-SCD, recommend chemical VTE prophylaxis Dispo-CT  Erby Pian, ANP-BC [facility] Pager [phone]([id number]:30P)   03/23/2015 8:17 AM

## 2015-03-23 NOTE — Progress Notes (Signed)
I took over care of this patient at 1500.  I agree with the nurses previous assessment.

## 2015-03-23 NOTE — Progress Notes (Signed)
General Surgery:  CT shows complete resolution of SBO/bezoar picture.   Contrast goes through and no residual stool. Jejunum at J-J staple line is 3 cm., borderline so no obvious stricture.  Will dc NG, allow cl liqs. May need outpatient work up such as endoscopy/enteroscopy  Discussed with patient who seems pleased.  Ernestene Mention

## 2015-03-24 DIAGNOSIS — K5669 Other intestinal obstruction: Secondary | ICD-10-CM

## 2015-03-24 DIAGNOSIS — K565 Intestinal adhesions [bands], unspecified as to partial versus complete obstruction: Secondary | ICD-10-CM | POA: Diagnosis present

## 2015-03-24 LAB — GLUCOSE, CAPILLARY
GLUCOSE-CAPILLARY: 171 mg/dL — AB (ref 65–99)
GLUCOSE-CAPILLARY: 96 mg/dL (ref 65–99)
Glucose-Capillary: 52 mg/dL — ABNORMAL LOW (ref 65–99)
Glucose-Capillary: 92 mg/dL (ref 65–99)
Glucose-Capillary: 143 mg/dL — ABNORMAL HIGH (ref 65–99)

## 2015-03-24 MED ORDER — CIPROFLOXACIN IN D5W 400 MG/200ML IV SOLN
400.0000 mg | Freq: Two times a day (BID) | INTRAVENOUS | Status: DC
  Administered 2015-03-24 – 2015-03-26 (×4): 400 mg via INTRAVENOUS
  Filled 2015-03-24 (×5): qty 200

## 2015-03-24 MED ORDER — METRONIDAZOLE IN NACL 5-0.79 MG/ML-% IV SOLN
500.0000 mg | Freq: Three times a day (TID) | INTRAVENOUS | Status: DC
  Administered 2015-03-24 – 2015-03-26 (×6): 500 mg via INTRAVENOUS
  Filled 2015-03-24 (×7): qty 100

## 2015-03-24 NOTE — Care Management Note (Signed)
Case Management Note  Patient Details  Name: Janice Santos MRN: [id number] Date of Birth: [birth year]  Subjective/Objective: PSBO-advancing diet, fulls. From home.                   Action/Plan:d/c plan home.   Expected Discharge Date:                  Expected Discharge Plan:  Home/Self Care  In-House Referral:     Discharge planning Services  CM Consult  Post Acute Care Choice:    Choice offered to:     DME Arranged:    DME Agency:     HH Arranged:    HH Agency:     Status of Service:  In process, will continue to follow  Medicare Important Message Given:    Date Medicare IM Given:    Medicare IM give by:    Date Additional Medicare IM Given:    Additional Medicare Important Message give by:     If discussed at Long Length of Stay Meetings, dates discussed:    Additional Comments:  Lanier Clam, RN 03/24/2015, 1:18 PM

## 2015-03-24 NOTE — Progress Notes (Addendum)
[facility] PROGRESS NOTE  Janice Santos ZOX:[id number] DOB: February 03, 1966 DOA: 03/19/2015 PCP: No primary care provider on file.  Summary 03/24/15: I have seen and examined Ms Janice Santos at bedside and reviewed her chart. Appreciate general surgery. Janice Santos is a pleasant 50 year old female with history multiple abdominal surgeries including gastric bypass, partial hysterectomy and cholecystectomy, hypertension, diabetes mellitus, GERD, depression, migraine, PTSD who presented with abdominal pain with nausea and vomiting for 5 days mainly located on the right side of the abdomen associated with 5 episodes of vomiting on the day prior to admission and she was found to have. high-grade small bowel obstruction felt related to adhesions. The small bowel obstruction has resolved with conservative management including bowel rest/NG tube placement/IV fluids. However, patient still has symptoms including left lower quadrant pain and nausea. I wonder if there is element of infection as patient has low-grade fever. Noted consideration for GI consultation if symptoms persisted. Will give Ciprofloxacin/Flagyl for possible peritonitis and reassess. Will consult GI if symptoms persist. Patient concerned that small bowel obstruction may recur in the future and that is unfortunately true. Plan SBO (small bowel obstruction) (HCC)/ Intestinal adhesions with obstruction (HCC)  Continue diet/analgesics/antiemetics per general surgery  Ciprofloxacin/Flagyl  Check TSH  Consider GI consultation for EGD if symptoms persist Major depressive disorder (HCC)/PTSD (post-traumatic stress disorder)/Diabetes mellitus without complication (HCC)/GERD (gastroesophageal reflux disease)/Hypertension  No acute changes  Continue current management  Code Status: Full Code Family Communication: None at bedside Disposition Plan: Home soon   Consultants:  Gen. surgery  Procedures:  None  Antibiotics:  Ciprofloxacin  03/24/2015>  Flagyl 03/24/2015>  HPI/Subjective: Complaints of LLQ pain and nausea  Objective: Filed Vitals:   03/24/15 0431 03/24/15 1319  BP: 131/85 118/74  Pulse: 90 102  Temp: 98.2 F (36.8 C) 98.1 F (36.7 C)  Resp: 20 18    Intake/Output Summary (Last 24 hours) at 03/24/15 2030 Last data filed at 03/24/15 1300  Gross per 24 hour  Intake 2515.83 ml  Output      0 ml  Net 2515.83 ml   Filed Weights   03/21/15 0607  Weight: 109.3 kg (240 lb 15.4 oz)    Exam:   General:  Comfortable at rest.  Cardiovascular: S1-S2 normal. No murmurs. Pulse regular.  Respiratory: Good air entry bilaterally. No rhonchi or rales.  Abdomen: Soft, tender LLQ, no rebound or guarding. Normal bowel sounds. No organomegaly.  Musculoskeletal: No pedal edema   Neurological: Intact  Data Reviewed: Basic Metabolic Panel:  Recent Labs Lab 03/19/15 1609 03/20/15 0508 03/22/15 0430 03/23/15 0440  NA 140 142 141 142  K 4.2 3.9 3.5 4.2  CL 109 110 106 111  CO2 20* GLUCOSE 145* 126* 83 162*  BUN 22* 23* 21* 11  CREATININE 0.92 0.96 0.71 0.68  CALCIUM 9.7 9.1 8.7* 9.2   Liver Function Tests:  Recent Labs Lab 03/19/15 1609  AST 43*  ALT 30  ALKPHOS 111  BILITOT 0.7  PROT 7.6  ALBUMIN 4.5    Recent Labs Lab 03/19/15 1609  LIPASE 15   No results for input(s): AMMONIA in the last 168 hours. CBC:  Recent Labs Lab 03/19/15 1609 03/20/15 0508 03/22/15 0430 03/23/15 0440  WBC 9.2 7.8 5.0 5.4  HGB 13.4 12.3 11.4* 11.4*  HCT 40.2 37.7 35.6* 35.9*  MCV 88.7 89.1 91.0 90.7  PLT 296 260 238 226   Cardiac Enzymes: No results for input(s): CKTOTAL, CKMB, CKMBINDEX, TROPONINI in the last 168 hours.  BNP (last 3 results) No results for input(s): BNP in the last 8760 hours.  ProBNP (last 3 results) No results for input(s): PROBNP in the last 8760 hours.  CBG:  Recent Labs Lab 03/23/15 2118 03/24/15 0724 03/24/15 1200 03/24/15 1245 03/24/15 1745   GLUCAP 113* 171* 52* 92 143*    No results found for this or any previous visit (from the past 240 hour(s)).   Studies: Ct Abdomen Pelvis Wo Contrast  03/23/2015  CLINICAL DATA:  Evaluate for small bowel obstruction. EXAM: CT ABDOMEN AND PELVIS WITHOUT CONTRAST TECHNIQUE: Multidetector CT imaging of the abdomen and pelvis was performed following the standard protocol without IV contrast. COMPARISON:  Abdomen x-ray March 22, 2015 FINDINGS: The liver, pancreas, spleen and adrenal glands are unremarkable. There is no hydronephrosis bilaterally. There probable 1 mm nonobstructing stones in both kidneys. Patient status post prior cholecystectomy. There is mild atherosclerosis of the aorta. There is no aneurysmal dilatation of aorta. There is no abdominal lymphadenopathy. There is evidence of prior gastric bypass surgery. There is no small bowel obstruction or diverticulitis. The appendix is not seen but no inflammation is noted around cecum. Fluid-filled bladder is normal. Patient status post prior hysterectomy. Mild dependent atelectasis of the posterior lung bases are identified. No acute abnormalities identified within the visualized bones. IMPRESSION: No evidence of small bowel obstruction. No acute abnormality identified in the abdomen and pelvis. Evidence of prior gastric bypass surgery, hysterectomy and cholecystectomy. Electronically Signed   By: Sherian Rein M.D.   On: 03/23/2015 12:44   Dg Abd 2 Views  03/23/2015  CLINICAL DATA:  Abdominal pain. Small bowel obstruction. Initial encounter. EXAM: ABDOMEN - 2 VIEW COMPARISON:  CT abdomen and pelvis 03/23/2015. Plain films of the abdomen 03/21/2015 and 03/22/2015. FINDINGS: NG tube tip remains at the gastroesophageal junction. The tube should be advanced approximately 9 cm for better positioning. Gas and stool are seen throughout the colon with oral contrast in the ascending colon identified. The lung bases are clear. IMPRESSION: No plain film  evidence of small bowel obstruction. Distended appearance of the colon with contrast in the ascending colon is noted. Question colonic ileus. NG tube tip is at the gastroesophageal junction. Recommend advancement of 9 cm. Electronically Signed   By: Drusilla Kanner M.D.   On: 03/23/2015 13:23    Scheduled Meds: . acyclovir  400 mg Oral BID  . amitriptyline  50 mg Oral QHS  . aspirin EC  81 mg Oral Daily  . cholecalciferol  5,000 Units Oral Daily  . ciprofloxacin  400 mg Intravenous Q12H  . enoxaparin (LOVENOX) injection  40 mg Subcutaneous Q24H  . FLUoxetine  40 mg Oral Daily  . insulin aspart  0-15 Units Subcutaneous TID WC  . lisinopril  5 mg Oral Daily  . metronidazole  500 mg Intravenous Q8H  . milk and molasses  1 enema Rectal BID  . multivitamin with minerals  1 tablet Oral Daily  . ondansetron (ZOFRAN) IV  4 mg Intravenous Once   Or  . ondansetron (ZOFRAN) IV  8 mg Intravenous Once  . pantoprazole (PROTONIX) IV  40 mg Intravenous Q12H  . pneumococcal 23 valent vaccine  0.5 mL Intramuscular Tomorrow-1000  . sodium chloride  3 mL Intravenous Q12H  . topiramate  100 mg Oral BID  . vitamin B-12  4,000 mcg Oral Daily   Continuous Infusions: . dextrose 5 % lactated ringers with kcl 125 mL/hr at 03/24/15 0815     Time spent: 25 minutes  Chanique Duca  [facility] Pager [phone]. If 7PM-7AM, please contact night-coverage at [email or web address], password [facility] 03/24/2015, 8:30 PM  LOS: 4 days

## 2015-03-24 NOTE — Progress Notes (Signed)
Patient ID: Janice Santos, female   DOB: 07-Mar-1965, 50 y.o.   MRN: [id number]     [address] [place], [place] [address]    Phone: [phone] FAX: [phone]     Subjective: Nausea and vomiting, bilious output, worse in supine position and with taking in clears.  No flatus or BM.   Objective:  Vital signs:  Filed Vitals:   03/23/15 0529 03/23/15 1325 03/23/15 2029 03/24/15 0431  BP: 142/96 130/89 129/88 131/85  Pulse: 98 89 91 90  Temp: 98.2 F (36.8 C) 99.3 F (37.4 C) 98.4 F (36.9 C) 98.2 F (36.8 C)  TempSrc: Oral Oral Oral Oral  Resp: _0 Height:      Weight:      SpO2: 99% 99% 91% 93%    Last BM Date: 03/18/15  Intake/Output   Yesterday:  01/17 0701 - 01/18 0700 In: 3599.2 [P.O.:720; I.V.:2879.2] Out: 100 [Emesis/NG output:100] This shift:     Physical Exam: General: Pt awake/alert/oriented x4 in no acute distress  Abdomen: +BS, abdomen is distended, mild generalized ttp without guarding.   Problem List:   Principal Problem:   SBO (small bowel obstruction) (HCC) Active Problems:   Major depressive disorder (HCC)   PTSD (post-traumatic stress disorder)   Diabetes mellitus without complication ([facility])   GERD (gastroesophageal reflux disease)   Hypertension    Results:   Labs: Results for orders placed or performed during the hospital encounter of 03/19/15 (from the past 48 hour(s))  Glucose, capillary     Status: Abnormal   Collection Time: 03/22/15 11:45 AM  Result Value Ref Range   Glucose-Capillary 107 (H) 65 - 99 mg/dL  Glucose, capillary     Status: Abnormal   Collection Time: 03/22/15  4:42 PM  Result Value Ref Range   Glucose-Capillary 141 (H) 65 - 99 mg/dL  Glucose, capillary     Status: Abnormal   Collection Time: 03/22/15  9:20 PM  Result Value Ref Range   Glucose-Capillary 136 (H) 65 - 99 mg/dL  CBC     Status: Abnormal   Collection Time: 03/23/15   4:40 AM  Result Value Ref Range   WBC 5.4 4.0 - 10.5 K/uL   RBC 3.96 3.87 - 5.11 MIL/uL   Hemoglobin 11.4 (L) 12.0 - 15.0 g/dL   HCT 35.9 (L) 36.0 - 46.0 %   MCV 90.7 78.0 - 100.0 fL   MCH 28.8 26.0 - 34.0 pg   MCHC 31.8 30.0 - 36.0 g/dL   RDW 12.9 11.5 - 15.5 %   Platelets 226 150 - 400 K/uL  Basic metabolic panel     Status: Abnormal   Collection Time: 03/23/15  4:40 AM  Result Value Ref Range   Sodium 142 135 - 145 mmol/L   Potassium 4.2 3.5 - 5.1 mmol/L    Comment: REPEATED TO VERIFY   Chloride 111 101 - 111 mmol/L   CO2 25 22 - 32 mmol/L   Glucose, Bld 162 (H) 65 - 99 mg/dL   BUN 11 6 - 20 mg/dL   Creatinine, Ser 0.68 0.44 - 1.00 mg/dL   Calcium 9.2 8.9 - 10.3 mg/dL   GFR calc non Af Amer >60 >60 mL/min   GFR calc Af Amer >60 >60 mL/min    Comment: (NOTE) The eGFR has been calculated using the CKD EPI equation. This calculation has not been validated in all clinical situations.  eGFR's persistently <60 mL/min signify possible Chronic Kidney Disease.    Anion gap 6 5 - 15  Glucose, capillary     Status: Abnormal   Collection Time: 03/23/15  7:21 AM  Result Value Ref Range   Glucose-Capillary 144 (H) 65 - 99 mg/dL   Comment 1 Notify RN    Comment 2 Document in Chart   Glucose, capillary     Status: Abnormal   Collection Time: 03/23/15 11:57 AM  Result Value Ref Range   Glucose-Capillary 121 (H) 65 - 99 mg/dL   Comment 1 Notify RN    Comment 2 Document in Chart   Glucose, capillary     Status: Abnormal   Collection Time: 03/23/15  4:39 PM  Result Value Ref Range   Glucose-Capillary 142 (H) 65 - 99 mg/dL   Comment 1 Notify RN    Comment 2 Document in Chart   Glucose, capillary     Status: Abnormal   Collection Time: 03/23/15  9:18 PM  Result Value Ref Range   Glucose-Capillary 113 (H) 65 - 99 mg/dL  Glucose, capillary     Status: Abnormal   Collection Time: 03/24/15  7:24 AM  Result Value Ref Range   Glucose-Capillary 171 (H) 65 - 99 mg/dL   Comment 1 Notify  RN    Comment 2 Document in Chart     Imaging / Studies: Ct Abdomen Pelvis Wo Contrast  03/23/2015  CLINICAL DATA:  Evaluate for small bowel obstruction. EXAM: CT ABDOMEN AND PELVIS WITHOUT CONTRAST TECHNIQUE: Multidetector CT imaging of the abdomen and pelvis was performed following the standard protocol without IV contrast. COMPARISON:  Abdomen x-ray March 22, 2015 FINDINGS: The liver, pancreas, spleen and adrenal glands are unremarkable. There is no hydronephrosis bilaterally. There probable 1 mm nonobstructing stones in both kidneys. Patient status post prior cholecystectomy. There is mild atherosclerosis of the aorta. There is no aneurysmal dilatation of aorta. There is no abdominal lymphadenopathy. There is evidence of prior gastric bypass surgery. There is no small bowel obstruction or diverticulitis. The appendix is not seen but no inflammation is noted around cecum. Fluid-filled bladder is normal. Patient status post prior hysterectomy. Mild dependent atelectasis of the posterior lung bases are identified. No acute abnormalities identified within the visualized bones. IMPRESSION: No evidence of small bowel obstruction. No acute abnormality identified in the abdomen and pelvis. Evidence of prior gastric bypass surgery, hysterectomy and cholecystectomy. Electronically Signed   By: Abelardo Diesel M.D.   On: 03/23/2015 12:44   Dg Abd 2 Views  03/23/2015  CLINICAL DATA:  Abdominal pain. Small bowel obstruction. Initial encounter. EXAM: ABDOMEN - 2 VIEW COMPARISON:  CT abdomen and pelvis 03/23/2015. Plain films of the abdomen 03/21/2015 and 03/22/2015. FINDINGS: NG tube tip remains at the gastroesophageal junction. The tube should be advanced approximately 9 cm for better positioning. Gas and stool are seen throughout the colon with oral contrast in the ascending colon identified. The lung bases are clear. IMPRESSION: No plain film evidence of small bowel obstruction. Distended appearance of the colon  with contrast in the ascending colon is noted. Question colonic ileus. NG tube tip is at the gastroesophageal junction. Recommend advancement of 9 cm. Electronically Signed   By: Inge Rise M.D.   On: 03/23/2015 13:23   Dg Abd 2 Views  03/22/2015  CLINICAL DATA:  Lower abdominal pain, nausea. History of small bowel obstruction. EXAM: ABDOMEN - 2 VIEW COMPARISON:  Plain films 03/21/2015.  CT 03/19/2015. FINDINGS: NG tube tip  is in the distal esophagus. Prior cholecystectomy. Gas and stool within the colon. Mildly prominent mid abdominal small bowel loops without convincing evidence for bowel obstruction. No free air organomegaly. IMPRESSION: Slight prominence of mid abdominal small bowel loops. Gas and stool noted within the colon. No convincing evidence for small bowel obstruction. NG tube tip in the distal esophagus appear Electronically Signed   By: Rolm Baptise M.D.   On: 03/22/2015 09:13    Medications / Allergies:  Scheduled Meds: . acyclovir  400 mg Oral BID  . amitriptyline  50 mg Oral QHS  . aspirin EC  81 mg Oral Daily  . cholecalciferol  5,000 Units Oral Daily  . enoxaparin (LOVENOX) injection  40 mg Subcutaneous Q24H  . FLUoxetine  40 mg Oral Daily  . insulin aspart  0-15 Units Subcutaneous TID WC  . lisinopril  5 mg Oral Daily  . milk and molasses  1 enema Rectal BID  . multivitamin with minerals  1 tablet Oral Daily  . ondansetron (ZOFRAN) IV  4 mg Intravenous Once   Or  . ondansetron (ZOFRAN) IV  8 mg Intravenous Once  . pantoprazole (PROTONIX) IV  40 mg Intravenous Q12H  . pneumococcal 23 valent vaccine  0.5 mL Intramuscular Tomorrow-1000  . sodium chloride  3 mL Intravenous Q12H  . topiramate  100 mg Oral BID  . vitamin B-12  4,000 mcg Oral Daily   Continuous Infusions: . 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  Cannot be in supine position due to worsening nausea.  She has been on a PPI BID.  Plan was to advance to bariatric fulls, however, I do not think the patient will be able to tolerate this.  Will discuss further management with Dr. Dalbert Batman.  VTE prophylaxis-SCD/lovenox    Erby Pian, [facility] Surgery Pager [phone]([phone])  03/24/2015 9:07 AM

## 2015-03-25 ENCOUNTER — Inpatient Hospital Stay (HOSPITAL_COMMUNITY): Payer: BLUE CROSS/BLUE SHIELD

## 2015-03-25 DIAGNOSIS — R1013 Epigastric pain: Secondary | ICD-10-CM

## 2015-03-25 DIAGNOSIS — R1110 Vomiting, unspecified: Secondary | ICD-10-CM

## 2015-03-25 LAB — GLUCOSE, CAPILLARY
GLUCOSE-CAPILLARY: 133 mg/dL — AB (ref 65–99)
Glucose-Capillary: 100 mg/dL — ABNORMAL HIGH (ref 65–99)
Glucose-Capillary: 95 mg/dL (ref 65–99)
Glucose-Capillary: 167 mg/dL — ABNORMAL HIGH (ref 65–99)

## 2015-03-25 LAB — COMPREHENSIVE METABOLIC PANEL
ALBUMIN: 3.4 g/dL — AB (ref 3.5–5.0)
ALK PHOS: 99 U/L (ref 38–126)
ALT: 37 U/L (ref 14–54)
AST: 44 U/L — AB (ref 15–41)
Anion gap: 9 (ref 5–15)
BILIRUBIN TOTAL: 0.7 mg/dL (ref 0.3–1.2)
BUN: 6 mg/dL (ref 6–20)
CALCIUM: 9 mg/dL (ref 8.9–10.3)
CO2: 25 mmol/L (ref 22–32)
CREATININE: 0.78 mg/dL (ref 0.44–1.00)
Chloride: 108 mmol/L (ref 101–111)
GFR calc Af Amer: >60 mL/min (ref >60)
GFR calc non Af Amer: >60 mL/min (ref >60)
GLUCOSE: 109 mg/dL — AB (ref 65–99)
POTASSIUM: 4.1 mmol/L (ref 3.5–5.1)
Sodium: 142 mmol/L (ref 135–145)
TOTAL PROTEIN: 6.1 g/dL — AB (ref 6.5–8.1)

## 2015-03-25 LAB — CBC WITH DIFFERENTIAL/PLATELET
BASOS ABS: 0 10*3/uL (ref 0.0–0.1)
BASOS PCT: 0 %
Eosinophils Absolute: 0.2 10*3/uL (ref 0.0–0.7)
Eosinophils Relative: 5 %
HEMATOCRIT: 35.8 % — AB (ref 36.0–46.0)
HEMOGLOBIN: 11.2 g/dL — AB (ref 12.0–15.0)
Lymphocytes Relative: 35 %
Lymphs Abs: 1.6 10*3/uL (ref 0.7–4.0)
MCH: 28.8 pg (ref 26.0–34.0)
MCHC: 31.3 g/dL (ref 30.0–36.0)
MCV: 92 fL (ref 78.0–100.0)
Monocytes Absolute: 0.4 10*3/uL (ref 0.1–1.0)
Monocytes Relative: 8 %
NEUTROS ABS: 2.4 10*3/uL (ref 1.7–7.7)
NEUTROS PCT: 52 %
Platelets: 240 10*3/uL (ref 150–400)
RBC: 3.89 MIL/uL (ref 3.87–5.11)
RDW: 13.1 % (ref 11.5–15.5)
WBC: 4.5 10*3/uL (ref 4.0–10.5)

## 2015-03-25 LAB — TSH: TSH: 1.209 u[IU]/mL (ref 0.350–4.500)

## 2015-03-25 MED ORDER — SORBITOL 70 % SOLN
960.0000 mL | TOPICAL_OIL | Freq: Once | ORAL | Status: AC
  Administered 2015-03-25: 960 mL via RECTAL
  Filled 2015-03-25: qty 240

## 2015-03-25 NOTE — Progress Notes (Signed)
Patient ID: Janice Santos, female   DOB: 1965-05-25, 50 y.o.   MRN: [id number]     [address] [place], [place] [address]    Phone: [phone] FAX: [phone]     Subjective: Dry heaves, has not thrown up any of her fulls.  C/o cramping after liquids.   Objective:  Vital signs:  Filed Vitals:   03/24/15 0431 03/24/15 1319 03/24/15 2114 03/25/15 0446  BP: 131/85 118/74 141/79 113/76  Pulse: 90 102 88 79  Temp: 98.2 F (36.8 C) 98.1 F (36.7 C) 98.3 F (36.8 C) 97.9 F (36.6 C)  TempSrc: Oral Oral Oral Oral  Resp: _0 Height:      Weight:      SpO2: 93% 100% 100% 100%    Last BM Date: 03/18/15  Intake/Output   Yesterday:  01/18 0701 - 01/19 0700 In: 4670.4 [P.O.:960; I.V.:3310.4; IV Piggyback:400] Out: -  This shift:  Physical Exam: General: Pt awake/alert/oriented x4 in no acute distress  Abdomen: +BS, abdomen is distended, mild generalized ttp without guarding.     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[place])    Results:   Labs: Results for orders placed or performed during the hospital encounter of 03/19/15 (from the past 48 hour(s))  Glucose, capillary     Status: Abnormal   Collection Time: 03/23/15 11:57 AM  Result Value Ref Range   Glucose-Capillary 121 (H) 65 - 99 mg/dL   Comment 1 Notify RN    Comment 2 Document in Chart   Glucose, capillary     Status: Abnormal   Collection Time: 03/23/15  4:39 PM  Result Value Ref Range   Glucose-Capillary 142 (H) 65 - 99 mg/dL   Comment 1 Notify RN    Comment 2 Document in Chart   Glucose, capillary     Status: Abnormal   Collection Time: 03/23/15  9:18 PM   Result Value Ref Range   Glucose-Capillary 113 (H) 65 - 99 mg/dL  Glucose, capillary     Status: Abnormal   Collection Time: 03/24/15  7:24 AM  Result Value Ref Range   Glucose-Capillary 171 (H) 65 - 99 mg/dL   Comment 1 Notify RN    Comment 2 Document in Chart   Glucose, capillary     Status: Abnormal   Collection Time: 03/24/15 12:00 PM  Result Value Ref Range   Glucose-Capillary 52 (L) 65 - 99 mg/dL   Comment 1 Notify RN    Comment 2 Document in Chart   Glucose, capillary     Status: None   Collection Time: 03/24/15 12:45 PM  Result Value Ref Range   Glucose-Capillary 92 65 - 99 mg/dL   Comment 1 Notify RN    Comment 2 Document in Chart   Glucose, capillary     Status: Abnormal   Collection Time: 03/24/15  5:45 PM  Result Value Ref Range   Glucose-Capillary 143 (H) 65 - 99 mg/dL   Comment 1 Notify RN    Comment 2 Document in Chart   Glucose, capillary     Status: None   Collection Time: 03/24/15  9:08 PM  Result Value Ref Range  Glucose-Capillary 96 65 - 99 mg/dL  CBC with Differential/Platelet     Status: Abnormal   Collection Time: 03/25/15  5:25 AM  Result Value Ref Range   WBC 4.5 4.0 - 10.5 K/uL   RBC 3.89 3.87 - 5.11 MIL/uL   Hemoglobin 11.2 (L) 12.0 - 15.0 g/dL   HCT 35.8 (L) 36.0 - 46.0 %   MCV 92.0 78.0 - 100.0 fL   MCH 28.8 26.0 - 34.0 pg   MCHC 31.3 30.0 - 36.0 g/dL   RDW 13.1 11.5 - 15.5 %   Platelets 240 150 - 400 K/uL   Neutrophils Relative % 52 %   Neutro Abs 2.4 1.7 - 7.7 K/uL   Lymphocytes Relative 35 %   Lymphs Abs 1.6 0.7 - 4.0 K/uL   Monocytes Relative 8 %   Monocytes Absolute 0.4 0.1 - 1.0 K/uL   Eosinophils Relative 5 %   Eosinophils Absolute 0.2 0.0 - 0.7 K/uL   Basophils Relative 0 %   Basophils Absolute 0.0 0.0 - 0.1 K/uL  Comprehensive metabolic panel     Status: Abnormal   Collection Time: 03/25/15  5:25 AM  Result Value Ref Range   Sodium 142 135 - 145 mmol/L   Potassium 4.1 3.5 - 5.1 mmol/L   Chloride 108 101 - 111 mmol/L    CO2 25 22 - 32 mmol/L   Glucose, Bld 109 (H) 65 - 99 mg/dL   BUN 6 6 - 20 mg/dL   Creatinine, Ser 0.78 0.44 - 1.00 mg/dL   Calcium 9.0 8.9 - 10.3 mg/dL   Total Protein 6.1 (L) 6.5 - 8.1 g/dL   Albumin 3.4 (L) 3.5 - 5.0 g/dL   AST 44 (H) 15 - 41 U/L   ALT 37 14 - 54 U/L   Alkaline Phosphatase 99 38 - 126 U/L   Total Bilirubin 0.7 0.3 - 1.2 mg/dL   GFR calc non Af Amer >60 >60 mL/min   GFR calc Af Amer >60 >60 mL/min    Comment: (NOTE) The eGFR has been calculated using the CKD EPI equation. This calculation has not been validated in all clinical situations. eGFR's persistently <60 mL/min signify possible Chronic Kidney Disease.    Anion gap 9 5 - 15  TSH     Status: None   Collection Time: 03/25/15  5:25 AM  Result Value Ref Range   TSH 1.209 0.350 - 4.500 uIU/mL  Glucose, capillary     Status: Abnormal   Collection Time: 03/25/15  7:37 AM  Result Value Ref Range   Glucose-Capillary 100 (H) 65 - 99 mg/dL    Imaging / Studies: Ct Abdomen Pelvis Wo Contrast  03/23/2015  CLINICAL DATA:  Evaluate for small bowel obstruction. EXAM: CT ABDOMEN AND PELVIS WITHOUT CONTRAST TECHNIQUE: Multidetector CT imaging of the abdomen and pelvis was performed following the standard protocol without IV contrast. COMPARISON:  Abdomen x-ray March 22, 2015 FINDINGS: The liver, pancreas, spleen and adrenal glands are unremarkable. There is no hydronephrosis bilaterally. There probable 1 mm nonobstructing stones in both kidneys. Patient status post prior cholecystectomy. There is mild atherosclerosis of the aorta. There is no aneurysmal dilatation of aorta. There is no abdominal lymphadenopathy. There is evidence of prior gastric bypass surgery. There is no small bowel obstruction or diverticulitis. The appendix is not seen but no inflammation is noted around cecum. Fluid-filled bladder is normal. Patient status post prior hysterectomy. Mild dependent atelectasis of the posterior lung bases are identified. No  acute abnormalities identified  within the visualized bones. IMPRESSION: No evidence of small bowel obstruction. No acute abnormality identified in the abdomen and pelvis. Evidence of prior gastric bypass surgery, hysterectomy and cholecystectomy. Electronically Signed   By: Abelardo Diesel M.D.   On: 03/23/2015 12:44   Dg Abd 2 Views  03/23/2015  CLINICAL DATA:  Abdominal pain. Small bowel obstruction. Initial encounter. EXAM: ABDOMEN - 2 VIEW COMPARISON:  CT abdomen and pelvis 03/23/2015. Plain films of the abdomen 03/21/2015 and 03/22/2015. FINDINGS: NG tube tip remains at the gastroesophageal junction. The tube should be advanced approximately 9 cm for better positioning. Gas and stool are seen throughout the colon with oral contrast in the ascending colon identified. The lung bases are clear. IMPRESSION: No plain film evidence of small bowel obstruction. Distended appearance of the colon with contrast in the ascending colon is noted. Question colonic ileus. NG tube tip is at the gastroesophageal junction. Recommend advancement of 9 cm. Electronically Signed   By: Inge Rise M.D.   On: 03/23/2015 13:23    Medications / Allergies:  Scheduled Meds: . acyclovir  400 mg Oral BID  . amitriptyline  50 mg Oral QHS  . aspirin EC  81 mg Oral Daily  . cholecalciferol  5,000 Units Oral Daily  . ciprofloxacin  400 mg Intravenous Q12H  . enoxaparin (LOVENOX) injection  40 mg Subcutaneous Q24H  . FLUoxetine  40 mg Oral Daily  . insulin aspart  0-15 Units Subcutaneous TID WC  . lisinopril  5 mg Oral Daily  . metronidazole  500 mg Intravenous Q8H  . milk and molasses  1 enema Rectal BID  . multivitamin with minerals  1 tablet Oral Daily  . ondansetron (ZOFRAN) IV  4 mg Intravenous Once   Or  . ondansetron (ZOFRAN) IV  8 mg Intravenous Once  . pantoprazole (PROTONIX) IV  40 mg Intravenous Q12H  . pneumococcal 23 valent vaccine  0.5 mL Intramuscular Tomorrow-1000  . sodium chloride  3 mL Intravenous  Q12H  . topiramate  100 mg Oral BID  . vitamin B-12  4,000 mcg Oral Daily   Continuous Infusions: . dextrose 5 % lactated ringers with kcl 125 mL/hr at 03/24/15 2351   PRN Meds:.acetaminophen **OR** acetaminophen, hydrALAZINE, HYDROmorphone (DILAUDID) injection, phenol, promethazine  Antibiotics: Anti-infectives    Start     Dose/Rate Route Frequency Ordered Stop   03/24/15 2200  ciprofloxacin (CIPRO) IVPB 400 mg     400 mg 200 mL/hr over 60 Minutes Intravenous Every 12 hours 03/24/15 2027     03/24/15 2100  metroNIDAZOLE (FLAGYL) IVPB 500 mg     500 mg 100 mL/hr over 60 Minutes Intravenous Every 8 hours 03/24/15 2027     03/20/15 1000  acyclovir (ZOVIRAX) tablet 400 mg     400 mg Oral 2 times daily 03/20/15 0117          Assessment/Plan 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  No flatus or BM.  Give SMOG enema now.  VTE prophylaxis-SCD/lovenox     Erby Pian, [facility] Pager [phone]([id number])   03/25/2015 8:56 AM

## 2015-03-25 NOTE — Progress Notes (Signed)
Janice Santos ZOX:[id number] DOB: 1966-02-05 DOA: 03/19/2015 PCP: No primary care provider on file.  Summary&Daily Progress Notes 03/24/15: I have seen and examined Janice Santos at bedside and reviewed her chart. Appreciate general surgery. Janice Santos is a pleasant 50 year old female with history multiple abdominal surgeries including gastric bypass, partial hysterectomy and cholecystectomy, hypertension, diabetes mellitus, GERD, depression, migraine, PTSD who presented with abdominal pain with nausea and vomiting for 5 days mainly located on the right side of the abdomen associated with 5 episodes of vomiting on the day prior to admission and she was found to have. high-grade small bowel obstruction felt related to adhesions. The small bowel obstruction has resolved with conservative management including bowel rest/NG tube placement/IV fluids. However, patient still has symptoms including left lower quadrant pain and nausea. I wonder if there is element of infection as patient has low-grade fever. Noted consideration for GI consultation if symptoms persisted. Will give Ciprofloxacin/Flagyl for possible peritonitis and reassess. Will consult GI if symptoms persist. Patient concerned that small bowel obstruction may recur in the future and that is unfortunately true. 03/25/15: Appreciate GI/general surgery. Patient still with some nausea but somewhat better. She will have EGD in a.m. Will continue current management with dilation from general surgery/GI. Problem List Plan  Principal Problem:   SBO (small bowel obstruction) (HCC) Active Problems:   Intestinal adhesions with obstruction (HCC)   Major depressive disorder (HCC)   PTSD (post-traumatic stress disorder)   Diabetes mellitus without complication (HCC)   GERD (gastroesophageal reflux disease)   Hypertension   Continue current management   Day 2 Ciprofloxacin/Flagyl  Follow GI/general surgery recommendations   Code Status: Full Code Family  Communication: None at bedside Disposition Plan: Home soon   Consultants:  Gen. Surgery  GI  Procedures:  None  Antibiotics:  Ciprofloxacin 03/24/2015>  Flagyl 03/24/2015>  HPI/Subjective: Somewhat better. Nausea with food.  Objective: Filed Vitals:   03/25/15 0446 03/25/15 1449  BP: 113/76 137/89  Pulse: 79 103  Temp: 97.9 F (36.6 C) 97.5 F (36.4 C)  Resp: 18 18    Intake/Output Summary (Last 24 hours) at 03/25/15 2014 Last data filed at 03/25/15 1600  Gross per 24 hour  Intake 4070.42 ml  Output      1 ml  Net 4069.42 ml   Filed Weights   03/21/15 0607  Weight: 109.3 kg (240 lb 15.4 oz)    Exam:   General:  Comfortable at rest.  Cardiovascular: S1-S2 normal. No murmurs. Pulse regular.  Respiratory: Good air entry bilaterally. No rhonchi or rales.  Abdomen: Soft and nontender. Normal bowel sounds. No organomegaly.  Musculoskeletal: No pedal edema   Neurological: Intact  Data Reviewed: Basic Metabolic Panel:  Recent Labs Lab 03/19/15 1609 03/20/15 0508 03/22/15 0430 03/23/15 0440 03/25/15 0525  NA 140 142 141 142 142  K 4.2 3.9 3.5 4.2 4.1  CL 109 110 106 111 108  CO2 20* GLUCOSE 145* 126* 83 162* 109*  BUN 22* 23* 21* 11 6  CREATININE 0.92 0.96 0.71 0.68 0.78  CALCIUM 9.7 9.1 8.7* 9.2 9.0   Liver Function Tests:  Recent Labs Lab 03/19/15 1609 03/25/15 0525  AST 43* 44*  ALT 30 37  ALKPHOS 111 99  BILITOT 0.7 0.7  PROT 7.6 6.1*  ALBUMIN 4.5 3.4*    Recent Labs Lab 03/19/15 1609  LIPASE 15   No results for input(s): AMMONIA in the last 168 hours. CBC:  Recent Labs Lab 03/19/15 1609 03/20/15  1610 03/22/15 0430 03/23/15 0440 03/25/15 0525  WBC 9.2 7.8 5.0 5.4 4.5  NEUTROABS  --   --   --   --  2.4  HGB 13.4 12.3 11.4* 11.4* 11.2*  HCT 40.2 37.7 35.6* 35.9* 35.8*  MCV 88.7 89.1 91.0 90.7 92.0  PLT 296 260 238 226 240   Cardiac Enzymes: No results for input(s): CKTOTAL, CKMB, CKMBINDEX,  TROPONINI in the last 168 hours. BNP (last 3 results) No results for input(s): BNP in the last 8760 hours.  ProBNP (last 3 results) No results for input(s): PROBNP in the last 8760 hours.  CBG:  Recent Labs Lab 03/24/15 1745 03/24/15 2108 03/25/15 0737 03/25/15 1218 03/25/15 1709  GLUCAP 143* 96 100* 95 133*    No results found for this or any previous visit (from the past 240 hour(s)).   Studies: Dg Abd 2 Views  03/25/2015  CLINICAL DATA:  Diffuse abdominal pain and constipation. Follow-up small bowel obstruction. EXAM: ABDOMEN - 2 VIEW COMPARISON:  03/23/2015. FINDINGS: The nasogastric tube has been removed. Cholecystectomy clips. Oral contrast in the distal colon which is normal in caliber. Dilated bowel loops containing air-fluid levels in the mid to upper abdomen centrally and on the right. Based on the recent CT, this is most likely redundant sigmoid colon and hepatic flexure. No free peritoneal air. Unremarkable bones. IMPRESSION: Probable ileus involving the sigmoid colon and hepatic flexure. Electronically Signed   By: Beckie Salts M.D.   On: 03/25/2015 11:56    Scheduled Meds: . acyclovir  400 mg Oral BID  . amitriptyline  50 mg Oral QHS  . aspirin EC  81 mg Oral Daily  . cholecalciferol  5,000 Units Oral Daily  . ciprofloxacin  400 mg Intravenous Q12H  . enoxaparin (LOVENOX) injection  40 mg Subcutaneous Q24H  . FLUoxetine  40 mg Oral Daily  . insulin aspart  0-15 Units Subcutaneous TID WC  . lisinopril  5 mg Oral Daily  . metronidazole  500 mg Intravenous Q8H  . milk and molasses  1 enema Rectal BID  . multivitamin with minerals  1 tablet Oral Daily  . ondansetron (ZOFRAN) IV  4 mg Intravenous Once   Or  . ondansetron (ZOFRAN) IV  8 mg Intravenous Once  . pantoprazole (PROTONIX) IV  40 mg Intravenous Q12H  . pneumococcal 23 valent vaccine  0.5 mL Intramuscular Tomorrow-1000  . sodium chloride  3 mL Intravenous Q12H  . topiramate  100 mg Oral BID  . vitamin  B-12  4,000 mcg Oral Daily   Continuous Infusions: . dextrose 5 % lactated ringers with kcl 125 mL/hr at 03/25/15 1944     Time spent: 25 minutes    Gizzelle Lacomb  [facility] Pager [phone]. If 7PM-7AM, please contact night-coverage at [email or web address], password [facility] 03/25/2015, 8:14 PM  LOS: 5 days

## 2015-03-25 NOTE — Consult Note (Signed)
Referring Provider:  [facility] Primary Care Physician:  No primary care provider on file. Primary Gastroenterologist:  unassigned  Reason for Consultation: nausea, abd pain     HPI: Janice Santos is a 50 y.o. female who was admitted on the 13th with epigastric abdominal pain, nausea, and vomiting. She is status post a cholecystectomy, hysterectomy, Roux-en-Y gastric bypass, and reduction mammoplasty. Her bypass surgery was done approximately 15 years ago in [place]. Other significant past medical history includes migraine headaches, PTSD, alcohol abuse (says she quit in April 2016), depression, diabetes, hypertension, and GERD. She presented to the emergency room with a five-day history of nausea, vomiting and epigastric abdominal pain. CTs of the abdomen and pelvis in the emergency room revealed a high-grade small bowel obstruction. She was admitted and placed on bowel rest and fluid resuscitation. An NG tube was placed. She began to improve and the NG tube was removed on the 17th. On the 18th she was again complaining of nausea, vomiting, and cramping. Abdominal films done earlier today revealed probable ileus involving the sigmoid colon and hepatic flexure. Patient reports that she is very nauseous and has not vomited but has been dry heaving. She complains of diffuse abdominal cramping after ingestion of either liquids. She states her last bowel movement was last Friday. She had milk of molasses enemas earlier this week. She says did not produce a bowel movement. He smog enema was ordered earlier this morning but she has not yet received it.  Patient reports that she had a similar admission at [facility] in [place] last year. She states she was told that she had scar tissue at her surgical site and was advised to have surgery. She opted not to have surgery. She states they told her she may have recurrent small bowel obstructions. She had a colonoscopy in October 2015 at [facility] in [place]. The indication was hematochezia. The colonic mucosa appeared normal in the rectum, sigmoid colon, descending colon, at the splenic flexure, and in the transverse colon. They were unable to traverse the hepatic flexure due to poor prep and adhesions. Retroflex views revealed no abnormality. She was advised to return in 1 year for colonoscopy due to a limited exam because of incomplete bowel prep. She had an EGD in October 2015 at [facility] in [place] as well. She was noted to have LA class a esophagitis. The Roux-en-Y anastomosis was found in the gastric body and looked healthy. No abnormalities in the jejunum were noted. She does report that she has been having frequent heartburn recently. She states that she will have copious regurgitation if she tries to lie supine and thus she has tried to sleep in a reclining position. She denies dysphagia.   Past Medical History  Diagnosis Date  . Alcoholism (HCC)   . Diabetes mellitus without complication (HCC)   . GERD (gastroesophageal reflux disease)   . Migraines   . Hypertension     Past Surgical History  Procedure Laterality Date  . Gastric bypass    . Abdominal hysterectomy    . Cholecystectomy      Prior to Admission medications   Medication Sig Start Date End Date Taking? Authorizing Provider  acyclovir (ZOVIRAX) 400 MG tablet Take 400 mg by mouth 2 (two) times daily.   Yes Historical Provider, MD  amitriptyline (ELAVIL) 25 MG tablet Take 50 mg by mouth at bedtime.   Yes Historical Provider, MD  aspirin EC 81 MG tablet Take 81 mg by mouth  daily.   Yes Historical Provider, MD  Cholecalciferol (VITAMIN D3) 5000 UNITS TABS Take 1 tablet by mouth daily.   Yes Historical Provider, MD  Cyanocobalamin (B-12) 2000 MCG TABS Take 4,000 mcg by mouth daily.   Yes Historical Provider, MD  FLUoxetine (PROZAC) 40 MG capsule Take 40 mg by mouth daily. 02/23/15  Yes Historical Provider, MD    lisinopril-hydrochlorothiazide (PRINZIDE,ZESTORETIC) 10-12.5 MG tablet Take 1 tablet by mouth daily.   Yes Historical Provider, MD  metFORMIN (GLUCOPHAGE) 500 MG tablet Take 1 tablet (500 mg total) by mouth 3 (three) times daily. 04/28/13  Yes Thermon Leyland, NP  Multiple Vitamin (MULTIVITAMIN WITH MINERALS) TABS tablet Take 1 tablet by mouth daily.   Yes Historical Provider, MD  topiramate (TOPAMAX) 100 MG tablet TAKE 1 TABLET (100 MG TOTAL) BY MOUTH 2 TIMES DAILY. 03/03/15  Yes Historical Provider, MD  omeprazole (PRILOSEC) 20 MG capsule Take 1 capsule (20 mg total) by mouth daily. Patient not taking: Reported on 12/09/2014 06/02/13   Kristen N Ward, DO  traMADol (ULTRAM) 50 MG tablet Take 1 tablet (50 mg total) by mouth every 6 (six) hours as needed. Patient not taking: Reported on 12/09/2014 06/05/13   Linwood Dibbles, MD    Current Facility-Administered Medications  Medication Dose Route Frequency Provider Last Rate Last Dose  . acetaminophen (TYLENOL) tablet 650 mg  650 mg Oral Q6H PRN Lorretta Harp, MD       Or  . acetaminophen (TYLENOL) suppository 650 mg  650 mg Rectal Q6H PRN Lorretta Harp, MD      . acyclovir (ZOVIRAX) tablet 400 mg  400 mg Oral BID Lorretta Harp, MD   400 mg at 03/25/15 0900  . amitriptyline (ELAVIL) tablet 50 mg  50 mg Oral QHS Lorretta Harp, MD   50 mg at 03/24/15 2235  . aspirin EC tablet 81 mg  81 mg Oral Daily Lorretta Harp, MD   81 mg at 03/25/15 0900  . cholecalciferol (VITAMIN D) tablet 5,000 Units  5,000 Units Oral Daily Lorretta Harp, MD   5,000 Units at 03/25/15 0900  . ciprofloxacin (CIPRO) IVPB 400 mg  400 mg Intravenous Q12H Simbiso Ranga, MD   400 mg at 03/24/15 2351  . dextrose 5% lactated ringers 1,000 mL with potassium chloride 40 mEq infusion   Intravenous Continuous Claud Kelp, MD 125 mL/hr at 03/24/15 2351    . enoxaparin (LOVENOX) injection 40 mg  40 mg Subcutaneous Q24H Nishant Dhungel, MD   40 mg at 03/24/15 1747  . FLUoxetine (PROZAC) capsule 40 mg  40 mg Oral Daily Lorretta Harp, MD   40 mg at 03/25/15 0900  . hydrALAZINE (APRESOLINE) injection 5 mg  5 mg Intravenous Q2H PRN Lorretta Harp, MD      . HYDROmorphone (DILAUDID) injection 1 mg  1 mg Intravenous Q4H PRN Rolan Lipa, NP   1 mg at 03/25/15 0859  . insulin aspart (novoLOG) injection 0-15 Units  0-15 Units Subcutaneous TID WC Claud Kelp, MD   2 Units at 03/24/15 1747  . lisinopril (PRINIVIL,ZESTRIL) tablet 5 mg  5 mg Oral Daily Lorretta Harp, MD   5 mg at 03/25/15 0900  . metroNIDAZOLE (FLAGYL) IVPB 500 mg  500 mg Intravenous Q8H Simbiso Ranga, MD   500 mg at 03/25/15 0446  . milk and molasses enema  1 enema Rectal BID Claud Kelp, MD   250 mL at 03/24/15 2237  . multivitamin with minerals tablet 1 tablet  1 tablet Oral Daily Brien Few  Clyde Lundborg, MD   1 tablet at 03/25/15 0900  . ondansetron (ZOFRAN) injection 4 mg  4 mg Intravenous Once Emina Riebock, NP   4 mg at 03/22/15 1030   Or  . ondansetron (ZOFRAN) 8 mg in sodium chloride 0.9 % 50 mL IVPB  8 mg Intravenous Once Emina Riebock, NP      . pantoprazole (PROTONIX) injection 40 mg  40 mg Intravenous Q12H Claud Kelp, MD   40 mg at 03/25/15 0900  . phenol (CHLORASEPTIC) mouth spray 1 spray  1 spray Mouth/Throat PRN Nishant Dhungel, MD      . pneumococcal 23 valent vaccine (PNU-IMMUNE) injection 0.5 mL  0.5 mL Intramuscular Tomorrow-1000 Nishant Dhungel, MD   0.5 mL at 03/22/15 1000  . promethazine (PHENERGAN) injection 25 mg  25 mg Intravenous Q6H PRN Nishant Dhungel, MD   25 mg at 03/24/15 2034  . sodium chloride 0.9 % injection 3 mL  3 mL Intravenous Q12H Lorretta Harp, MD   3 mL at 03/24/15 0930  . sorbitol, milk of mag, mineral oil, glycerin (SMOG) enema  960 mL Rectal Once Emina Riebock, NP      . topiramate (TOPAMAX) tablet 100 mg  100 mg Oral BID Lorretta Harp, MD   100 mg at 03/25/15 0900  . vitamin B-12 (CYANOCOBALAMIN) tablet 4,000 mcg  4,000 mcg Oral Daily Lorretta Harp, MD   4,000 mcg at 03/25/15 0900    Allergies as of 03/19/2015 - Review Complete  03/19/2015  Allergen Reaction Noted  . Morphine and related Hives and Itching 06/05/2013  . Sulfa antibiotics Hives and Itching 06/05/2013    Family History  Problem Relation Age of Onset  . Diabetes Mother   . Hypertension Mother   . Diabetes Father   . Hypertension Father   . Diabetes Brother   . Hypertension Brother   . Diabetes Sister     Social History   Social History  . Marital Status: Legally Separated    Spouse Name: N/A  . Number of Children: N/A  . Years of Education: N/A   Occupational History  . Not on file.   Social History Main Topics  . Smoking status: Never Smoker   . Smokeless tobacco: Not on file  . Alcohol Use: No     Comment: recovering alcoholic- last drink 06/2014  . Drug Use: No  . Sexual Activity: No   Other Topics Concern  . Not on file   Social History Narrative   ** Merged History Encounter **        Review of Systems: Gen: Denies any fever, chills, sweats. States appetite has been poor CV: Denies chest pain, angina, palpitations, syncope, orthopnea, PND, peripheral edema, and claudication. Resp: Denies dyspnea at rest, dyspnea with exercise, cough, sputum, wheezing, coughing up blood, and pleurisy. GI: Denies vomiting blood, jaundice, and fecal incontinence.   Denies dysphagia or odynophagia. Has nausea, vomiting, abdominal pain GU : Denies urinary burning, blood in urine, urinary frequency, urinary hesitancy, nocturnal urination, and urinary incontinence. MS: Denies joint pain, limitation of movement, and swelling, stiffness, low back pain, extremity pain. Denies muscle weakness, cramps, atrophy.  Derm: Denies rash, itching, dry skin, hives, moles, warts, or unhealing ulcers.  Psych: Denies depression, anxiety, memory loss, suicidal ideation, hallucinations, paranoia, and confusion. Heme: Denies bruising, bleeding, and enlarged lymph nodes. Neuro:  Denies any headaches, dizziness, paresthesias. Endo:  Denies any problems with   thyroid, adrenal function.  Physical Exam: Vital signs in last 24 hours: Temp:  [97.9  F (36.6 C)-98.3 F (36.8 C)] 97.9 F (36.6 C) (01/19 0446) Pulse Rate:  [79-102] 79 (01/19 0446) Resp:  [18] 18 (01/19 0446) BP: (113-141)/(74-79) 113/76 mmHg (01/19 0446) SpO2:  [100 %] 100 % (01/19 0446) Last BM Date: 03/18/15 General:   Alert,  Well-developed, well-nourished, pleasant and cooperative in NAD Head:  Normocephalic and atraumatic. Eyes:  Sclera clear, no icterus.   Conjunctiva pink. Ears:  Normal auditory acuity. Nose:  No deformity, discharge,  or lesions. Mouth:  No deformity or lesions.   Neck:  Supple; no masses or thyromegaly. Lungs:  Clear throughout to auscultation.   No wheezes, crackles, or rhonchi  Heart:  Regular rate and rhythm; no murmurs, clicks, rubs,  or gallops. Abdomen:  Soft, mild diffuse tenderness to palpation with no rebound or guarding, mild distention,, BS active,nonpalp mass or hsm.   Rectal:  Deferred  Msk:  Symmetrical without gross deformities. . Pulses:  Normal pulses noted. Extremities: Without clubbing or edema. Neurologic:Alert and  oriented x4;  grossly normal neurologically. Skin: Intact without significant lesions or rashes.. Psych:  Alert and cooperative. Normal mood and affect.   Lab Results:  Recent Labs  03/23/15 0440 03/25/15 0525  WBC 5.4 4.5  HGB 11.4* 11.2*  HCT 35.9* 35.8*  PLT 226 240   BMET  Recent Labs  03/23/15 0440 03/25/15 0525  NA 142 142  K 4.2 4.1  CL 111 108  CO2 25 25  GLUCOSE 162* 109*  BUN 11 6  CREATININE 0.68 0.78  CALCIUM 9.2 9.0   LFT  Recent Labs  03/25/15 0525  PROT 6.1*  ALBUMIN 3.4*  AST 44*  ALT 37  ALKPHOS 99  BILITOT 0.7    Studies/Results: Ct Abdomen Pelvis Wo Contrast  03/23/2015  CLINICAL DATA:  Evaluate for small bowel obstruction. EXAM: CT ABDOMEN AND PELVIS WITHOUT CONTRAST TECHNIQUE: Multidetector CT imaging of the abdomen and pelvis was performed following the standard  protocol without IV contrast. COMPARISON:  Abdomen x-ray March 22, 2015 FINDINGS: The liver, pancreas, spleen and adrenal glands are unremarkable. There is no hydronephrosis bilaterally. There probable 1 mm nonobstructing stones in both kidneys. Patient status post prior cholecystectomy. There is mild atherosclerosis of the aorta. There is no aneurysmal dilatation of aorta. There is no abdominal lymphadenopathy. There is evidence of prior gastric bypass surgery. There is no small bowel obstruction or diverticulitis. The appendix is not seen but no inflammation is noted around cecum. Fluid-filled bladder is normal. Patient status post prior hysterectomy. Mild dependent atelectasis of the posterior lung bases are identified. No acute abnormalities identified within the visualized bones. IMPRESSION: No evidence of small bowel obstruction. No acute abnormality identified in the abdomen and pelvis. Evidence of prior gastric bypass surgery, hysterectomy and cholecystectomy. Electronically Signed   By: Sherian Rein M.D.   On: 03/23/2015 12:44   Dg Abd 2 Views  03/23/2015  CLINICAL DATA:  Abdominal pain. Small bowel obstruction. Initial encounter. EXAM: ABDOMEN - 2 VIEW COMPARISON:  CT abdomen and pelvis 03/23/2015. Plain films of the abdomen 03/21/2015 and 03/22/2015. FINDINGS: NG tube tip remains at the gastroesophageal junction. The tube should be advanced approximately 9 cm for better positioning. Gas and stool are seen throughout the colon with oral contrast in the ascending colon identified. The lung bases are clear. IMPRESSION: No plain film evidence of small bowel obstruction. Distended appearance of the colon with contrast in the ascending colon is noted. Question colonic ileus. NG tube tip is at the gastroesophageal junction. Recommend  advancement of 9 cm. Electronically Signed   By: Drusilla Kanner M.D.   On: 03/23/2015 13:23    IMPRESSION/PLAN:  50 year old female with a history of small bowel  obstruction and intestinal adhesions with obstruction, admitted with high grade small bowel obstruction. Symptoms initially were improving with NG decompression, but once NG tube removed patient is again having nausea and with liquids. She admits to frequent regurgitation and heartburn as well. She reports that she has been on a PPI in the past but has only been using it on an as-needed basis. She also reports that she has been constipated and her last bowel movement was 6 days ago. Films today show dilated bowel loops containing air-fluid levels in the mid to upper abdomen centrally on the right, likely redundant sigmoid and hepatic flexure. Agree with smog enema and would start on a daily bowel regimen of Mira lax 2 capfuls in water. With regards to nausea, regurgitation and heartburn, she has a long-standing history of GERD and should remain on her PPI regularly. She will be scheduled for an EGD tomorrow to evaluate for gastritis, ulcer, gastric stasis etc.The risks, benefits, and alternatives to endoscopy with possible biopsy and possible dilation were discussed with the patient and they consent to proceed.  Will review with attending as to timing of colonoscopy as well, as this may be able to be performed as an outpatient.  Major depressive disorder/PTSD Diabetes GERD Hypertension   Hvozdovic, Tollie Pizza PA-C 03/25/2015,  Pager [phone]  Mon-Fri 8a-5p [phone] after 5p, weekends, holidays    [facility] Attending   I have taken an interval history, reviewed the chart and examined the patient. I agree with the Advanced Practitioner's note, impression and recommendations.   Endoscopic evaluation to see if obstruction - will try to see GJ and JJ anastomosis - will use pediatric colonoscope or enteroscope.  The risks and benefits as well as alternatives of endoscopic procedure(s) have been discussed and reviewed. All questions answered. The patient agrees to proceed.  Iva Boop, MD,  [facility] [phone] (pager) [phone] after 5 PM, weekends and holidays  03/25/2015 4:40 PM

## 2015-03-25 NOTE — Progress Notes (Signed)
Inpatient Diabetes Program Recommendations  AACE/ADA: New Consensus Statement on Inpatient Glycemic Control (2015)  Target Ranges:  Prepandial:   less than 140 mg/dL      Peak postprandial:   less than 180 mg/dL (1-2 hours)      Critically ill patients:  140 - 180 mg/dL   Review of Glycemic Control Results for Janice Santos, Janice Santos (MRN [id number]) as of 03/25/2015 08:53  Ref. Range 03/24/2015 12:00  Glucose-Capillary Latest Ref Range: 65-99 mg/dL 52 (L)   Inpatient Diabetes Program Recommendations:  Correction (SSI): decrease Novolog to sensitive scale  Thank you  Piedad Climes BSN, RN,CDE Inpatient Diabetes Coordinator [phone] (team pager)

## 2015-03-25 NOTE — Progress Notes (Signed)
Dear Doctor:Ranga This patient has been identified as a candidate for PICC for the following reason (s): IV therapy over 48 hours, poor veins/poor circulatory system (CHF, COPD, emphysema, diabetes, steroid use, IV drug abuse, etc.) and restarts due to phlebitis and infiltration in 24 hours If you agree, please write an order for the indicated device. For any questions contact the Vascular Access Team at [phone] if no answer, please leave a message.  Thank you for supporting the early vascular access assessment program. 

## 2015-03-25 NOTE — Progress Notes (Signed)
Medical Records that were requested from [place], [place] have been received and placed on the patient's shadow chart.   Arta Bruce [facility] 03/25/2015 12:06 PM

## 2015-03-26 ENCOUNTER — Encounter (HOSPITAL_COMMUNITY)
Admission: EM | Disposition: A | Discharge status: Home / Self Care | Payer: Self-pay | Source: Home / Self Care | Attending: Internal Medicine

## 2015-03-26 ENCOUNTER — Inpatient Hospital Stay (HOSPITAL_COMMUNITY): Payer: BLUE CROSS/BLUE SHIELD | Admitting: Anesthesiology

## 2015-03-26 ENCOUNTER — Encounter (HOSPITAL_COMMUNITY): Payer: Self-pay | Admitting: Anesthesiology

## 2015-03-26 HISTORY — PX: ESOPHAGOGASTRODUODENOSCOPY (EGD) WITH PROPOFOL: SHX5813

## 2015-03-26 LAB — BASIC METABOLIC PANEL
ANION GAP: 7 (ref 5–15)
BUN: 6 mg/dL (ref 6–20)
CALCIUM: 9.6 mg/dL (ref 8.9–10.3)
CO2: 25 mmol/L (ref 22–32)
CREATININE: 0.87 mg/dL (ref 0.44–1.00)
Chloride: 112 mmol/L — ABNORMAL HIGH (ref 101–111)
GFR calc Af Amer: >60 mL/min (ref >60)
GLUCOSE: 105 mg/dL — AB (ref 65–99)
Potassium: 4.2 mmol/L (ref 3.5–5.1)
Sodium: 144 mmol/L (ref 135–145)

## 2015-03-26 LAB — GLUCOSE, CAPILLARY
Glucose-Capillary: 92 mg/dL (ref 65–99)
Glucose-Capillary: 133 mg/dL — ABNORMAL HIGH (ref 65–99)
Glucose-Capillary: 146 mg/dL — ABNORMAL HIGH (ref 65–99)
Glucose-Capillary: 115 mg/dL — ABNORMAL HIGH (ref 65–99)

## 2015-03-26 LAB — CBC
HCT: 36.9 % (ref 36.0–46.0)
Hemoglobin: 11.6 g/dL — ABNORMAL LOW (ref 12.0–15.0)
MCH: 28.9 pg (ref 26.0–34.0)
MCHC: 31.4 g/dL (ref 30.0–36.0)
MCV: 92 fL (ref 78.0–100.0)
PLATELETS: 224 10*3/uL (ref 150–400)
RBC: 4.01 MIL/uL (ref 3.87–5.11)
RDW: 13 % (ref 11.5–15.5)
WBC: 4.3 10*3/uL (ref 4.0–10.5)

## 2015-03-26 SURGERY — ESOPHAGOGASTRODUODENOSCOPY (EGD) WITH PROPOFOL
Anesthesia: Monitor Anesthesia Care

## 2015-03-26 MED ORDER — PROPOFOL 10 MG/ML IV BOLUS
INTRAVENOUS | Status: AC
  Filled 2015-03-26: qty 20

## 2015-03-26 MED ORDER — METOCLOPRAMIDE HCL 5 MG PO TABS: 5.0000 mg | ORAL_TABLET | Freq: Four times a day (QID) | ORAL | Status: DC

## 2015-03-26 MED ORDER — PROPOFOL 500 MG/50ML IV EMUL
INTRAVENOUS | Status: DC | PRN
  Administered 2015-03-26: 50 mg via INTRAVENOUS
  Administered 2015-03-26: 30 mg via INTRAVENOUS
  Administered 2015-03-26: 20 mg via INTRAVENOUS

## 2015-03-26 MED ORDER — LIDOCAINE HCL (CARDIAC) 20 MG/ML IV SOLN
INTRAVENOUS | Status: AC
  Filled 2015-03-26: qty 5

## 2015-03-26 MED ORDER — PROPOFOL 10 MG/ML IV BOLUS
INTRAVENOUS | Status: AC
Start: 2015-03-26 — End: 2015-03-26
  Filled 2015-03-26: qty 40

## 2015-03-26 MED ORDER — TRAMADOL HCL 50 MG PO TABS: 100.0000 mg | ORAL_TABLET | Freq: Four times a day (QID) | ORAL | Status: DC | PRN

## 2015-03-26 MED ORDER — PROPOFOL 500 MG/50ML IV EMUL
INTRAVENOUS | Status: DC | PRN
  Administered 2015-03-26: 140 ug/kg/min via INTRAVENOUS

## 2015-03-26 SURGICAL SUPPLY — 14 items

## 2015-03-26 NOTE — Progress Notes (Signed)
NUTRITION NOTE  Pt seen for consult for diet education. Pt had SBO on admission and NGT for this was removed 1/18. 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 She does avoid steak, pork, and fried foods out of preference. She states that she was hospitalized in April 2016 for SBO. She states that MD has advised her to follow "low ruffage" diet indefinitely.   Talked with pt about fiber content of foods and fiber's role in the diet and digestion of fiber. Discussed high fiber foods and limiting these or avoiding them as able. Also encouraged low fiber foods and provided pt with handouts outlining high and low fiber options from all food groups. Encouraged pt to talk with MD concerning the amount of fiber she should be consuming/day based on dx and recommendations for avoiding high fiber diet.   Provided pt with several handouts from the Academy of Nutrition and Dietetics: "High Fiber Nutrition Therapy," "High Fiber Foods List," "Low Fiber Nutrition Therapy," and "Low Fiber Foods List."   Pt very appreciative of information and denies any further questions or concerns at this time.     Trenton Gammon, RD, LDN Inpatient Clinical Dietitian Pager # [phone] After hours/weekend pager # [phone]

## 2015-03-26 NOTE — Progress Notes (Addendum)
Day of Surgery  Subjective: Upper endoscopy looks good.  Discussed with Dr. Leone Payor.  Gastroview jejunostomy junction at 40 cm and patent and able to retroflex.  Jejunum looked normal.  JJ anastomotic area 110 cm from the mouth looked normal.  Afferent and efferent limbs  looked normal but not explored much.  She has chronic pain, but it is still unclear whether she has simply problems with eating a lot of roughage, alternatively a motility disorder, or possibly adhesive partial obstruction downstream. We'll try full liquids and if she has further symptoms will probably do a barium study as a prelude to surgical exploration.  I explained this approach to her this morning.  Objective: Vital signs in last 24 hours: Temp:  [97.5 F (36.4 C)-98.1 F (36.7 C)] 97.8 F (36.6 C) (01/20 0846) Pulse Rate:  [78-103] 89 (01/20 0731) Resp:  [11-18] 12 (01/20 0846) BP: (112-137)/(63-102) 112/73 mmHg (01/20 0846) SpO2:  [97 %-100 %] 100 % (01/20 0846) Last BM Date: March 26, 2015  Intake/Output from previous day: Mar 25, 2022 0701 - 01/20 0700 In: 4060 [P.O.:360; I.V.:3000; IV Piggyback:700] Out: 1 [Stool:1] Intake/Output this shift: Total I/O In: 90 [I.V.:90] Out: -   General appearance: Reasonably alert following endoscopy.   Abdomen soft.  Subjectively a little tender but not distended.   Lab Results:   Recent Labs  2015/03/26 0525 03/26/15 0440  WBC 4.5 4.3  HGB 11.2* 11.6*  HCT 35.8* 36.9  PLT 240 224   BMET  Recent Labs  03-26-15 0525 03/26/15 0440  NA 142 144  K 4.1 4.2  CL 108 112*  CO2 25 25  GLUCOSE 109* 105*  BUN 6 6  CREATININE 0.78 0.87  CALCIUM 9.0 9.6   PT/INR No results for input(s): LABPROT, INR in the last 72 hours. ABG No results for input(s): PHART, HCO3 in the last 72 hours.  Invalid input(s): PCO2, PO2  Studies/Results: Dg Abd 2 Views  26-Mar-2015  CLINICAL DATA:  Diffuse abdominal pain and constipation. Follow-up small bowel obstruction. EXAM: ABDOMEN - 2  VIEW COMPARISON:  03/23/2015. FINDINGS: The nasogastric tube has been removed. Cholecystectomy clips. Oral contrast in the distal colon which is normal in caliber. Dilated bowel loops containing air-fluid levels in the mid to upper abdomen centrally and on the right. Based on the recent CT, this is most likely redundant sigmoid colon and hepatic flexure. No free peritoneal air. Unremarkable bones. IMPRESSION: Probable ileus involving the sigmoid colon and hepatic flexure. Electronically Signed   By: Beckie Salts M.D.   On: 03-26-15 11:56    Anti-infectives: Anti-infectives    Start     Dose/Rate Route Frequency Ordered Stop   03/24/15 2200  ciprofloxacin (CIPRO) IVPB 400 mg     400 mg 200 mL/hr over 60 Minutes Intravenous Every 12 hours 03/24/15 2027     03/24/15 2100  metroNIDAZOLE (FLAGYL) IVPB 500 mg     500 mg 100 mL/hr over 60 Minutes Intravenous Every 8 hours 03/24/15 2027     03/20/15 1000  acyclovir (ZOVIRAX) tablet 400 mg     400 mg Oral 2 times daily 03/20/15 0117        Assessment/Plan: s/p Procedure(s): ESOPHAGOGASTRODUODENOSCOPY (EGD) WITH PROPOFOL  Partial SBO versus motility disorder versus bezoar..    Radiographically her obstruction has resolved, but she continues to have intermittent cramping.  She also reports emesis although not documented by nursing     I am not sure whether she has a mechanical obstruction, motility disorder, or had a bezoar  that has resolved.     Since EGD normal at both GJ and JJ, will start back on full liquids     If she does well she will be discharged home and follow-up with Dr. Daphine Deutscher in the office, who is aware of her situation and has examined her this admission.     If she become symptomatic over the weekend I would favor a barium small bowel follow-through with  radiologist supervision.  I suppose it is possible that she has some type of mechanical problem distal to the JJ.     If she continues to be symptomatic she may require abdominal  exploration to sort this out.  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  SCD and Lovenox   LOS: 6 days    Camaryn Lumbert M 03/26/2015

## 2015-03-26 NOTE — Anesthesia Postprocedure Evaluation (Signed)
Anesthesia Post Note  Patient: Janice Santos  Procedure(s) Performed: Procedure(s) (LRB): ESOPHAGOGASTRODUODENOSCOPY (EGD) WITH PROPOFOL (N/A)  Patient location during evaluation: PACU Anesthesia Type: MAC Level of consciousness: patient uncooperative Pain management: pain level controlled Vital Signs Assessment: post-procedure vital signs reviewed and stable Respiratory status: spontaneous breathing, nonlabored ventilation, respiratory function stable and patient connected to nasal cannula oxygen Cardiovascular status: stable and blood pressure returned to baseline Anesthetic complications: no    Last Vitals:  Filed Vitals:   03/26/15 0731 03/26/15 0846  BP: 130/87 112/73  Pulse: 89   Temp:  36.6 C  Resp: 11 12    Last Pain:  Filed Vitals:   03/26/15 0954  PainSc: 8                  Dene Nazir J

## 2015-03-26 NOTE — Transfer of Care (Signed)
Immediate Anesthesia Transfer of Care Note  Patient: Janice Santos  Procedure(s) Performed: Procedure(s): ESOPHAGOGASTRODUODENOSCOPY (EGD) WITH PROPOFOL (N/A)  Patient Location: PACU  Anesthesia Type:MAC  Level of Consciousness: awake, alert  and oriented  Airway & Oxygen Therapy: Patient Spontanous Breathing and Patient connected to nasal cannula oxygen  Post-op Assessment: Report given to RN and Post -op Vital signs reviewed and stable  Post vital signs: Reviewed and stable  Last Vitals:  Filed Vitals:   03/26/15 0412 03/26/15 0731  BP: 125/88 130/87  Pulse: 78 89  Temp: 36.7 C   Resp: 18 11    Complications: No apparent anesthesia complications

## 2015-03-26 NOTE — Discharge Summary (Signed)
Quinta Eimer, is a 50 y.o. female  DOB [birth year]  MRN [id number].  Admission date:  03/19/2015  Admitting Physician  Lorretta Harp, MD  Discharge Date:  03/26/2015   Primary MD  No primary care provider on file.  Recommendations for primary care physician for things to follow:  Streamline medications as possible.   Admission Diagnosis   Diabetes mellitus without complication (HCC) [E11.9] Alcoholism (HCC) [F10.20] SBO (small bowel obstruction) (HCC) [K56.69] Gastroesophageal reflux disease without esophagitis [K21.9] Essential hypertension [I10]   Discharge Diagnosis  Diabetes mellitus without complication (HCC) [E11.9] Alcoholism (HCC) [F10.20] SBO (small bowel obstruction) (HCC) [K56.69] Gastroesophageal reflux disease without esophagitis [K21.9] Essential hypertension [I10]   Active Problems:   Major depressive disorder (HCC)   PTSD (post-traumatic stress disorder)   Diabetes mellitus without complication (HCC)   GERD (gastroesophageal reflux disease)   Hypertension      Hospital Course  Hazely Sealey is a pleasant 50 year old female with history of multiple abdominal surgeries who came in with abdominal pain associated with nausea and vomiting and she was found to have high-grade small bowel obstruction felt to be related to adhesions. She responded rather slowly to conservative management including bowel rest/NG tube placement/IV fluids and this prompted GI consultation culminating in EGD performed by Dr. Leone Payor on 03/26/2015 with no acute findings. The thought is that patient may have had some bezoars, hence recommendations for low residue foods. Patient was seen by general surgery and dietary during this hospital stay. It will be great if patient can manage without narcotics as these may compound her  abdominal problems. She received 2 days of empiric antibiotics but these were not continued at discharge as there was no clear evidence of infection. Would err on the side of giving antibiotics if she started having abdominal symptoms. She is tolerating feeding and would like to go home. She is discharged in stable condition to follow with her PCP and gastroenterology/general surgery as needed. Please refer to the daily progress notes below for details of patient's hospital stay. Summary&Daily Progress Notes 03/24/15: I have seen and examined Ms Lawyer at bedside and reviewed her chart. Appreciate general surgery. Nayda Riesen is a pleasant 50 year old female with history multiple abdominal surgeries including gastric bypass, partial hysterectomy and cholecystectomy, hypertension, diabetes mellitus, GERD, depression, migraine, PTSD who presented with abdominal pain with nausea and vomiting for 5 days mainly located on the right side of the abdomen associated with 5 episodes of vomiting on the day prior to admission and she was found to have. high-grade small bowel obstruction felt related to adhesions. The small bowel obstruction has resolved with conservative management including bowel rest/NG tube placement/IV fluids. However, patient still has symptoms including left lower quadrant pain and nausea. I wonder if there is element of infection as patient has low-grade fever. Noted consideration for GI consultation if symptoms persisted. Will give Ciprofloxacin/Flagyl for possible peritonitis and reassess. Will consult GI if symptoms persist. Patient concerned that small bowel obstruction may recur in the future and that is unfortunately  true. 03/25/15: Appreciate GI/general surgery. Patient still with some nausea but somewhat better. She will have EGD in a.m. Will continue current management with dilation from general surgery/GI. 03/26/15: Appreciate GI/general surgery/dietary. Feels better and would like to go  home. Will not continue antibiotics rather defer to her primary care providers to resume them if evidence of infection, which is not clear this point.  Discharge Condition Stable.  Consults obtained  General surgery GI Dietary  Follow UP PCP GI General surgery   Discharge Instructions  and  Discharge Medications  Discharge Instructions    Diet - low sodium heart healthy    Complete by:  As directed   Low residual     Increase activity slowly    Complete by:  As directed             Medication List    TAKE these medications        acyclovir 400 MG tablet  Commonly known as:  ZOVIRAX  Take 400 mg by mouth 2 (two) times daily.     amitriptyline 25 MG tablet  Commonly known as:  ELAVIL  Take 50 mg by mouth at bedtime.     aspirin EC 81 MG tablet  Take 81 mg by mouth daily.     B-12 2000 MCG Tabs  Take 4,000 mcg by mouth daily.     FLUoxetine 40 MG capsule  Commonly known as:  PROZAC  Take 40 mg by mouth daily.     lisinopril-hydrochlorothiazide 10-12.5 MG tablet  Commonly known as:  PRINZIDE,ZESTORETIC  Take 1 tablet by mouth daily.     metFORMIN 500 MG tablet  Commonly known as:  GLUCOPHAGE  Take 1 tablet (500 mg total) by mouth 3 (three) times daily.     metoCLOPramide 5 MG tablet  Commonly known as:  REGLAN  Take 1 tablet (5 mg total) by mouth 4 (four) times daily.     multivitamin with minerals Tabs tablet  Take 1 tablet by mouth daily.     omeprazole 20 MG capsule  Commonly known as:  PRILOSEC  Take 1 capsule (20 mg total) by mouth daily.     topiramate 100 MG tablet  Commonly known as:  TOPAMAX  TAKE 1 TABLET (100 MG TOTAL) BY MOUTH 2 TIMES DAILY.     traMADol 50 MG tablet  Commonly known as:  ULTRAM  Take 2 tablets (100 mg total) by mouth every 6 (six) hours as needed.     Vitamin D3 5000 units Tabs  Take 1 tablet by mouth daily.        Diet and Activity recommendation: See Discharge Instructions above  Major procedures and  Radiology Reports - PLEASE review detailed and final reports for all details, in brief -    Ct Abdomen Pelvis Wo Contrast  03/23/2015  CLINICAL DATA:  Evaluate for small bowel obstruction. EXAM: CT ABDOMEN AND PELVIS WITHOUT CONTRAST TECHNIQUE: Multidetector CT imaging of the abdomen and pelvis was performed following the standard protocol without IV contrast. COMPARISON:  Abdomen x-ray March 22, 2015 FINDINGS: The liver, pancreas, spleen and adrenal glands are unremarkable. There is no hydronephrosis bilaterally. There probable 1 mm nonobstructing stones in both kidneys. Patient status post prior cholecystectomy. There is mild atherosclerosis of the aorta. There is no aneurysmal dilatation of aorta. There is no abdominal lymphadenopathy. There is evidence of prior gastric bypass surgery. There is no small bowel obstruction or diverticulitis. The appendix is not seen but no inflammation is noted around  cecum. Fluid-filled bladder is normal. Patient status post prior hysterectomy. Mild dependent atelectasis of the posterior lung bases are identified. No acute abnormalities identified within the visualized bones. IMPRESSION: No evidence of small bowel obstruction. No acute abnormality identified in the abdomen and pelvis. Evidence of prior gastric bypass surgery, hysterectomy and cholecystectomy. Electronically Signed   By: Sherian Rein M.D.   On: 03/23/2015 12:44   Ct Abdomen Pelvis W Contrast  03/19/2015  CLINICAL DATA:  Burning sensation the abdomen radiates the back. Pain for 3 days EXAM: CT ABDOMEN AND PELVIS WITH CONTRAST TECHNIQUE: Multidetector CT imaging of the abdomen and pelvis was performed using the standard protocol following bolus administration of intravenous contrast. CONTRAST:  25mL OMNIPAQUE IOHEXOL 300 MG/ML SOLN, OMNIPAQUE IOHEXOL 300 MG/ML SOLN COMPARISON:  None. FINDINGS: Lower chest: Lung bases are clear. Hepatobiliary: No focal hepatic lesion. Postcholecystectomy. No biliary  dilatation. Pancreas: Pancreas is normal. No ductal dilatation. No pancreatic inflammation. Spleen: Normal spleen Adrenals/urinary tract: Adrenal glands and kidneys are normal. The ureters and bladder normal. Stomach/Bowel: status post gastric bypass surgery. Contrast flows through the gastrojejunostomy into the efferent limb. There is stasis of enteric contents of this limb leading up to the enteric enteric anastomosis in the lower mid abdomen. Loops of bowel measure up to 6 cm at this level (image 59, series 2). There is stasis of enteric contents with fecalization of this stool in the small bowel. The more distal small bowel is collapsed. Colon is relatively collapsed. No free fluid.  No pneumatosis. Vascular/Lymphatic: Abdominal aorta is normal caliber. There is no retroperitoneal or periportal lymphadenopathy. No pelvic lymphadenopathy. Reproductive: Post hysterectomy anatomy Other: No free fluid. Musculoskeletal: No aggressive osseous lesion. IMPRESSION: 1. Small bowel obstruction at the level of the distal enteric enteric anastomosis. This obstruction is long standing with the loops of small bowel significantly distended up to 6 cm and stasis of enteric content with fecalization of stool. Obstruction appears fairly high-grade at this point. 2. More proximal gastrojejunostomy is patent. Electronically Signed   By: Genevive Bi M.D.   On: 03/19/2015 23:38   Dg Abd 2 Views  03/25/2015  CLINICAL DATA:  Diffuse abdominal pain and constipation. Follow-up small bowel obstruction. EXAM: ABDOMEN - 2 VIEW COMPARISON:  03/23/2015. FINDINGS: The nasogastric tube has been removed. Cholecystectomy clips. Oral contrast in the distal colon which is normal in caliber. Dilated bowel loops containing air-fluid levels in the mid to upper abdomen centrally and on the right. Based on the recent CT, this is most likely redundant sigmoid colon and hepatic flexure. No free peritoneal air. Unremarkable bones. IMPRESSION:  Probable ileus involving the sigmoid colon and hepatic flexure. Electronically Signed   By: Beckie Salts M.D.   On: 03/25/2015 11:56   Dg Abd 2 Views  03/23/2015  CLINICAL DATA:  Abdominal pain. Small bowel obstruction. Initial encounter. EXAM: ABDOMEN - 2 VIEW COMPARISON:  CT abdomen and pelvis 03/23/2015. Plain films of the abdomen 03/21/2015 and 03/22/2015. FINDINGS: NG tube tip remains at the gastroesophageal junction. The tube should be advanced approximately 9 cm for better positioning. Gas and stool are seen throughout the colon with oral contrast in the ascending colon identified. The lung bases are clear. IMPRESSION: No plain film evidence of small bowel obstruction. Distended appearance of the colon with contrast in the ascending colon is noted. Question colonic ileus. NG tube tip is at the gastroesophageal junction. Recommend advancement of 9 cm. Electronically Signed   By: Drusilla Kanner M.D.   On:  03/23/2015 13:23   Dg Abd 2 Views  03/22/2015  CLINICAL DATA:  Lower abdominal pain, nausea. History of small bowel obstruction. EXAM: ABDOMEN - 2 VIEW COMPARISON:  Plain films 03/21/2015.  CT 03/19/2015. FINDINGS: NG tube tip is in the distal esophagus. Prior cholecystectomy. Gas and stool within the colon. Mildly prominent mid abdominal small bowel loops without convincing evidence for bowel obstruction. No free air organomegaly. IMPRESSION: Slight prominence of mid abdominal small bowel loops. Gas and stool noted within the colon. No convincing evidence for small bowel obstruction. NG tube tip in the distal esophagus appear Electronically Signed   By: Charlett Nose M.D.   On: 03/22/2015 09:13   Dg Abd Acute W/chest  03/19/2015  CLINICAL DATA:  Burning sensation abdomen. EXAM: DG ABDOMEN ACUTE W/ 1V CHEST COMPARISON:  None. FINDINGS: Lungs are clear.  No free air beneath hemidiaphragm. No dilated loops of large or small bowel. Stool in the RIGHT colon. Gas in the rectum. No pathologic  calcifications. No organomegaly. Postcholecystectomy. IMPRESSION: 1.  No acute cardiopulmonary process. 2. No bowel obstruction or intraperitoneal free air. Electronically Signed   By: Genevive Bi M.D.   On: 03/19/2015 21:30   Dg Abd Portable 1v  03/21/2015  CLINICAL DATA:  Abdominal pain 1 week. Followup small bowel obstruction. EXAM: PORTABLE ABDOMEN - 1 VIEW COMPARISON:  CT 03/19/2015 and plain films 03/19/2015 FINDINGS: Examination demonstrates a nonobstructive bowel gas pattern with air, contrast and stool throughout the colon. No dilated small bowel loops. Mild degenerative change of the hips. 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 IMPRESSION: Nonobstructive bowel gas pattern. 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 Electronically Signed   By: Elberta Fortis M.D.   On: 03/21/2015 08:29    Micro Results   No results found for this or any previous visit (from the past 240 hour(s)).     Today   Subjective:   Angus Seller today has no headache,no chest abdominal pain,no new weakness tingling or numbness, feels much better wants to go home today.   Objective:   Blood pressure 102/55, pulse 82, temperature 98.2 F (36.8 C), temperature source Oral, resp. rate 16, height 5\' 10"  (1.778 m), weight 109.3 kg (240 lb 15.4 oz), SpO2 98 %.   Intake/Output Summary (Last 24 hours) at 03/26/15 1818 Last data filed at 03/26/15 0846  Gross per 24 hour  Intake   3790 ml  Output      0 ml  Net   3790 ml    Exam Awake Alert, Oriented x 3, No new F.N deficits, Normal affect [place].AT,PERRAL Supple Neck,No JVD, No cervical lymphadenopathy appriciated.  Symmetrical Chest wall movement, Good air movement bilaterally, CTAB RRR,No Gallops,Rubs or new Murmurs, No Parasternal Heave +ve B.Sounds, Abd Soft, Non tender, No organomegaly appriciated, No rebound -guarding or rigidity. No Cyanosis, Clubbing or edema, No new Rash or bruise  Data Review   CBC  w Diff: Lab Results  Component Value Date   WBC 4.3 03/26/2015   HGB 11.6* 03/26/2015   HCT 36.9 03/26/2015   PLT 224 03/26/2015   LYMPHOPCT 35 03/25/2015   MONOPCT 8 03/25/2015   EOSPCT 5 03/25/2015   BASOPCT 0 03/25/2015    CMP: Lab Results  Component Value Date   NA 144 03/26/2015   K 4.2 03/26/2015   CL 112* 03/26/2015   CO2 25 03/26/2015   BUN 6 03/26/2015   CREATININE 0.87 03/26/2015   PROT 6.1* 03/25/2015   ALBUMIN  3.4* 03/25/2015   BILITOT 0.7 03/25/2015   ALKPHOS 99 03/25/2015   AST 44* 03/25/2015   ALT 37 03/25/2015  .   Total Time in preparing paper work, data evaluation and todays exam - 25 minutes  Arvel Oquinn M.D on 03/26/2015 at 6:18 PM  [facility] Office  [phone]

## 2015-03-26 NOTE — Progress Notes (Deleted)
Nutrition Education Note  RD consulted for nutrition education regarding new onset CHF.  RD provided "Low Sodium Nutrition Therapy," "Sodium-Free Flavoring Tips," and "Sodium Content of Foods List" handouts from the Academy of [employer] and Dietetics. Reviewed patient's dietary recall. Provided examples on ways to decrease sodium intake in diet. Discouraged intake of processed foods and use of salt shaker. Encouraged fresh fruits and vegetables as well as whole grain sources of carbohydrates to maximize fiber intake.   RD discussed why it is important for patient to adhere to diet recommendations, and emphasized the role of fluids, foods to avoid, and importance of weighing self daily. Teach back method used.  Spoke with pt and husband, who is at bedside. Pt reports she was recently given CHF folder with information but has not had a chance to review this information. Pt states that MD has talked with her briefly about foods, specifically pickles. Pt is able to give examples of high Na foods as well as explain why limiting salt in her diet would be beneficial (decrease fluid retention, take pressure off of her heart). Went into more detail about sodium content of foods as well as the benefit of limiting salt intake for relief of symptoms and improvement in overall health. Pt and husband state that they often go out to eat although the husband does sometimes cook at home. He does use [employer] and states he does not add salt when cooking; encouraged continuing this behavior. They do not have access to the internet to check restaurant menus for nutritional information prior to meals.   Husband does not seem to be supportive of pt and needed dietary changes. He states things such as "I will sneak pickles up to your room for you." Again reiterated the benefits of small, daily changes concerning diet.   Expect fair to poor compliance.  Body mass index is 34.57 kg/(m^2). Pt meets criteria for obesity based  on current BMI.  Current diet order is Bariatric FLD, patient is consuming approximately 50-100% of meals at this time. Labs and medications reviewed. No further nutrition interventions warranted at this time. RD contact information provided. If additional nutrition issues arise, please re-consult RD.     Janice Santos, RD, LDN Inpatient Clinical Dietitian Pager # [phone] After hours/weekend pager # [phone] 

## 2015-03-26 NOTE — Op Note (Addendum)
[facility] [address] [place], [address]   ENDOSCOPY PROCEDURE REPORT  PATIENT: Janice Santos, Janice Santos  MR#: [id number] BIRTHDATE: [birth year] , 49  yrs. old GENDER: female ENDOSCOPIST: Iva Boop, MD, [facility] PROCEDURE DATE:  03/26/2015 PROCEDURE:  Enteroscopy ASA CLASS:     Class II INDICATIONS:  vomiting, s/p gastric bypass. MEDICATIONS: Per Anesthesia and Monitored anesthesia care TOPICAL ANESTHETIC: Cetacaine Spray DESCRIPTION OF PROCEDURE: After the risks benefits and alternatives of the procedure were thoroughly explained, informed consent was obtained.  The PENTAX GASTOROSCOPE [id number] endoscope was introduced through the mouth and advanced to the second portion of the duodenum , Without limitations.  The instrument was slowly withdrawn as the mucosa was fully examined.  1) GE junction at 35 cm 2) GJ junction at 40 cm and able to retroflex 3) jejunum looked NL 4) reached JJ anastomotic area at 110 cm from mouth - looked normal best I can tell - patent efferent past it but could not really explore Y limb - bile seen. Retroflexed views revealed no abnormalities.     The scope was then withdrawn from the patient and the procedure completed. COMPLICATIONS: There were no immediate complications.  ENDOSCOPIC IMPRESSION: 1) GE junction at 35 cm 2) GJ junction at 40 cm and able to retroflex 3) jejunum looked NL 4) reached JJ anastomotic area at 110 cm from mouth - looked normal best I can tell - patent efferent past it but could not really explore Y limb - bile seen  RECOMMENDATIONS: Plans per surgery - would make sure she stays on a low residue diet to avoid potential bezoars which might have explained this admission/obstruction Call me if needed  eSigned:  Iva Boop, MD, [facility] 03/26/2015 9:38 AMRevised: 03/26/2015 9:38 AM

## 2015-03-26 NOTE — Anesthesia Preprocedure Evaluation (Addendum)
Anesthesia Evaluation  Patient identified by MRN, date of birth, ID band Patient awake    Reviewed: Allergy & Precautions, NPO status , Patient's Chart, lab work & pertinent test results  Airway Mallampati: II  TM Distance: >3 FB Neck ROM: Full    Dental no notable dental hx.    Pulmonary neg pulmonary ROS,    Pulmonary exam normal breath sounds clear to auscultation       Cardiovascular Exercise Tolerance: Good hypertension, Pt. on medications Normal cardiovascular exam Rhythm:Regular Rate:Normal     Neuro/Psych  Headaches, PSYCHIATRIC DISORDERS Depression    GI/Hepatic Neg liver ROS, GERD  Medicated,  Endo/Other  diabetes, Type 2, Oral Hypoglycemic Agents  Renal/GU negative Renal ROS  negative genitourinary   Musculoskeletal negative musculoskeletal ROS (+)   Abdominal (+) + obese,   Peds negative pediatric ROS (+)  Hematology negative hematology ROS (+)   Anesthesia Other Findings   Reproductive/Obstetrics negative OB ROS                            Anesthesia Physical Anesthesia Plan  ASA: II  Anesthesia Plan: MAC   Post-op Pain Management:    Induction: Intravenous  Airway Management Planned: Natural Airway  Additional Equipment:   Intra-op Plan:   Post-operative Plan:   Informed Consent: I have reviewed the patients History and Physical, chart, labs and discussed the procedure including the risks, benefits and alternatives for the proposed anesthesia with the patient or authorized representative who has indicated his/her understanding and acceptance.   Dental advisory given  Plan Discussed with: CRNA  Anesthesia Plan Comments:         Anesthesia Quick Evaluation

## 2015-03-29 ENCOUNTER — Encounter (HOSPITAL_COMMUNITY): Payer: Self-pay | Admitting: Internal Medicine

## 2015-05-20 ENCOUNTER — Observation Stay (HOSPITAL_COMMUNITY)
Admission: EM | Admit: 2015-05-20 | Discharge: 2015-05-22 | Disposition: A | Discharge status: Home / Self Care | Payer: BLUE CROSS/BLUE SHIELD | Attending: Family Medicine | Admitting: Family Medicine

## 2015-05-20 ENCOUNTER — Emergency Department (HOSPITAL_COMMUNITY): Payer: BLUE CROSS/BLUE SHIELD

## 2015-05-20 ENCOUNTER — Encounter (HOSPITAL_COMMUNITY): Payer: Self-pay | Admitting: Nurse Practitioner

## 2015-05-20 DIAGNOSIS — R079 Chest pain, unspecified: Secondary | ICD-10-CM | POA: Diagnosis not present

## 2015-05-20 DIAGNOSIS — Z9049 Acquired absence of other specified parts of digestive tract: Secondary | ICD-10-CM | POA: Diagnosis not present

## 2015-05-20 DIAGNOSIS — Z90710 Acquired absence of both cervix and uterus: Secondary | ICD-10-CM | POA: Diagnosis not present

## 2015-05-20 DIAGNOSIS — E119 Type 2 diabetes mellitus without complications: Secondary | ICD-10-CM | POA: Diagnosis not present

## 2015-05-20 DIAGNOSIS — F329 Major depressive disorder, single episode, unspecified: Secondary | ICD-10-CM | POA: Insufficient documentation

## 2015-05-20 DIAGNOSIS — E785 Hyperlipidemia, unspecified: Secondary | ICD-10-CM | POA: Insufficient documentation

## 2015-05-20 DIAGNOSIS — K219 Gastro-esophageal reflux disease without esophagitis: Secondary | ICD-10-CM | POA: Insufficient documentation

## 2015-05-20 DIAGNOSIS — H109 Unspecified conjunctivitis: Secondary | ICD-10-CM

## 2015-05-20 DIAGNOSIS — K589 Irritable bowel syndrome without diarrhea: Secondary | ICD-10-CM | POA: Insufficient documentation

## 2015-05-20 DIAGNOSIS — F1021 Alcohol dependence, in remission: Secondary | ICD-10-CM | POA: Insufficient documentation

## 2015-05-20 DIAGNOSIS — Z7984 Long term (current) use of oral hypoglycemic drugs: Secondary | ICD-10-CM | POA: Diagnosis not present

## 2015-05-20 DIAGNOSIS — G43909 Migraine, unspecified, not intractable, without status migrainosus: Secondary | ICD-10-CM | POA: Diagnosis not present

## 2015-05-20 DIAGNOSIS — Z79899 Other long term (current) drug therapy: Secondary | ICD-10-CM | POA: Diagnosis not present

## 2015-05-20 DIAGNOSIS — I10 Essential (primary) hypertension: Secondary | ICD-10-CM | POA: Diagnosis not present

## 2015-05-20 DIAGNOSIS — Z9884 Bariatric surgery status: Secondary | ICD-10-CM | POA: Diagnosis not present

## 2015-05-20 DIAGNOSIS — R220 Localized swelling, mass and lump, head: Secondary | ICD-10-CM | POA: Diagnosis not present

## 2015-05-20 DIAGNOSIS — R000 Tachycardia, unspecified: Secondary | ICD-10-CM | POA: Diagnosis not present

## 2015-05-20 LAB — I-STAT CHEM 8, ED
BUN: 17 mg/dL (ref 6–20)
CALCIUM ION: 1.15 mmol/L (ref 1.12–1.23)
CHLORIDE: 110 mmol/L (ref 101–111)
CREATININE: 0.9 mg/dL (ref 0.44–1.00)
GLUCOSE: 116 mg/dL — AB (ref 65–99)
HCT: 42 % (ref 36.0–46.0)
Hemoglobin: 14.3 g/dL (ref 12.0–15.0)
POTASSIUM: 3.8 mmol/L (ref 3.5–5.1)
Sodium: 143 mmol/L (ref 135–145)
TCO2: 19 mmol/L (ref 0–100)

## 2015-05-20 LAB — CBC
HEMATOCRIT: 38.9 % (ref 36.0–46.0)
Hemoglobin: 13 g/dL (ref 12.0–15.0)
MCH: 29.3 pg (ref 26.0–34.0)
MCHC: 33.4 g/dL (ref 30.0–36.0)
MCV: 87.8 fL (ref 78.0–100.0)
PLATELETS: 259 10*3/uL (ref 150–400)
RBC: 4.43 MIL/uL (ref 3.87–5.11)
RDW: 13.9 % (ref 11.5–15.5)
WBC: 5.6 10*3/uL (ref 4.0–10.5)

## 2015-05-20 LAB — GLUCOSE, CAPILLARY: Glucose-Capillary: 226 mg/dL — ABNORMAL HIGH (ref 65–99)

## 2015-05-20 LAB — BASIC METABOLIC PANEL
Anion gap: 10 (ref 5–15)
BUN: 16 mg/dL (ref 6–20)
CALCIUM: 9.6 mg/dL (ref 8.9–10.3)
CO2: 20 mmol/L — AB (ref 22–32)
Chloride: 111 mmol/L (ref 101–111)
Creatinine, Ser: 0.97 mg/dL (ref 0.44–1.00)
GFR calc Af Amer: >60 mL/min (ref >60)
GLUCOSE: 127 mg/dL — AB (ref 65–99)
Potassium: 4 mmol/L (ref 3.5–5.1)
Sodium: 141 mmol/L (ref 135–145)

## 2015-05-20 LAB — I-STAT TROPONIN, ED: Troponin i, poc: 0 ng/mL (ref 0.00–0.08)

## 2015-05-20 LAB — TROPONIN I: Troponin I: <0.03 ng/mL (ref <0.031)

## 2015-05-20 MED ORDER — ACETAMINOPHEN 325 MG PO TABS
650.0000 mg | ORAL_TABLET | ORAL | Status: DC | PRN
  Administered 2015-05-21: 650 mg via ORAL
  Filled 2015-05-20: qty 2

## 2015-05-20 MED ORDER — THIAMINE HCL 100 MG/ML IJ SOLN: 100.0000 mg | Freq: Every day | INTRAMUSCULAR | Status: DC

## 2015-05-20 MED ORDER — AMITRIPTYLINE HCL 50 MG PO TABS
50.0000 mg | ORAL_TABLET | Freq: Every day | ORAL | Status: DC
  Administered 2015-05-20 – 2015-05-21 (×2): 50 mg via ORAL
  Filled 2015-05-20 (×3): qty 1

## 2015-05-20 MED ORDER — DIPHENHYDRAMINE HCL 50 MG/ML IJ SOLN
25.0000 mg | Freq: Once | INTRAMUSCULAR | Status: AC
  Administered 2015-05-20: 25 mg via INTRAVENOUS
  Filled 2015-05-20: qty 1

## 2015-05-20 MED ORDER — LORAZEPAM 2 MG/ML IJ SOLN: 1.0000 mg | Freq: Four times a day (QID) | INTRAMUSCULAR | Status: DC | PRN

## 2015-05-20 MED ORDER — FAMOTIDINE IN NACL 20-0.9 MG/50ML-% IV SOLN
20.0000 mg | Freq: Once | INTRAVENOUS | Status: AC
  Administered 2015-05-20: 20 mg via INTRAVENOUS
  Filled 2015-05-20: qty 50

## 2015-05-20 MED ORDER — ENOXAPARIN SODIUM 40 MG/0.4ML ~~LOC~~ SOLN
40.0000 mg | SUBCUTANEOUS | Status: DC
  Administered 2015-05-20 – 2015-05-21 (×2): 40 mg via SUBCUTANEOUS
  Filled 2015-05-20 (×2): qty 0.4

## 2015-05-20 MED ORDER — METHYLPREDNISOLONE SODIUM SUCC 125 MG IJ SOLR: 125.0000 mg | Freq: Once | INTRAMUSCULAR | Status: DC

## 2015-05-20 MED ORDER — TOPIRAMATE 100 MG PO TABS
100.0000 mg | ORAL_TABLET | Freq: Two times a day (BID) | ORAL | Status: DC
  Administered 2015-05-20 – 2015-05-22 (×4): 100 mg via ORAL
  Filled 2015-05-20 (×7): qty 1

## 2015-05-20 MED ORDER — ADULT MULTIVITAMIN W/MINERALS CH
1.0000 | ORAL_TABLET | Freq: Every day | ORAL | Status: DC
  Administered 2015-05-20 – 2015-05-22 (×3): 1 via ORAL
  Filled 2015-05-20 (×3): qty 1

## 2015-05-20 MED ORDER — DIPHENHYDRAMINE HCL 25 MG PO CAPS
25.0000 mg | ORAL_CAPSULE | Freq: Four times a day (QID) | ORAL | Status: DC | PRN
  Administered 2015-05-20: 25 mg via ORAL
  Filled 2015-05-20: qty 1

## 2015-05-20 MED ORDER — TETRACAINE HCL 0.5 % OP SOLN
2.0000 [drp] | Freq: Once | OPHTHALMIC | Status: DC
  Filled 2015-05-20: qty 2

## 2015-05-20 MED ORDER — LUBIPROSTONE 24 MCG PO CAPS
24.0000 ug | ORAL_CAPSULE | Freq: Two times a day (BID) | ORAL | Status: DC
  Administered 2015-05-20 – 2015-05-22 (×4): 24 ug via ORAL
  Filled 2015-05-20 (×4): qty 1

## 2015-05-20 MED ORDER — ASPIRIN 81 MG PO CHEW
324.0000 mg | CHEWABLE_TABLET | Freq: Once | ORAL | Status: AC
  Administered 2015-05-20: 324 mg via ORAL
  Filled 2015-05-20: qty 4

## 2015-05-20 MED ORDER — INSULIN ASPART 100 UNIT/ML ~~LOC~~ SOLN: 0.0000 [IU] | Freq: Three times a day (TID) | SUBCUTANEOUS | Status: DC

## 2015-05-20 MED ORDER — LORAZEPAM 1 MG PO TABS: 1.0000 mg | ORAL_TABLET | Freq: Four times a day (QID) | ORAL | Status: DC | PRN

## 2015-05-20 MED ORDER — SODIUM CHLORIDE 0.9 % IV BOLUS (SEPSIS)
1000.0000 mL | Freq: Once | INTRAVENOUS | Status: AC
  Administered 2015-05-20: 1000 mL via INTRAVENOUS

## 2015-05-20 MED ORDER — INSULIN ASPART 100 UNIT/ML ~~LOC~~ SOLN
0.0000 [IU] | Freq: Every day | SUBCUTANEOUS | Status: DC
  Administered 2015-05-20: 2 [IU] via SUBCUTANEOUS

## 2015-05-20 MED ORDER — FOLIC ACID 1 MG PO TABS
1.0000 mg | ORAL_TABLET | Freq: Every day | ORAL | Status: DC
  Administered 2015-05-20 – 2015-05-22 (×3): 1 mg via ORAL
  Filled 2015-05-20 (×3): qty 1

## 2015-05-20 MED ORDER — LINACLOTIDE 145 MCG PO CAPS
145.0000 ug | ORAL_CAPSULE | Freq: Every day | ORAL | Status: DC
  Administered 2015-05-20 – 2015-05-22 (×3): 145 ug via ORAL
  Filled 2015-05-20 (×5): qty 1

## 2015-05-20 MED ORDER — VITAMIN B-1 100 MG PO TABS
100.0000 mg | ORAL_TABLET | Freq: Every day | ORAL | Status: DC
  Administered 2015-05-20 – 2015-05-22 (×3): 100 mg via ORAL
  Filled 2015-05-20 (×3): qty 1

## 2015-05-20 MED ORDER — FLUOXETINE HCL 20 MG PO CAPS
40.0000 mg | ORAL_CAPSULE | Freq: Every day | ORAL | Status: DC
  Administered 2015-05-20 – 2015-05-22 (×3): 40 mg via ORAL
  Filled 2015-05-20 (×3): qty 2

## 2015-05-20 MED ORDER — POLYMYXIN B-TRIMETHOPRIM 10000-0.1 UNIT/ML-% OP SOLN
1.0000 [drp] | OPHTHALMIC | Status: DC
  Administered 2015-05-20 – 2015-05-22 (×7): 1 [drp] via OPHTHALMIC
  Filled 2015-05-20 (×2): qty 10

## 2015-05-20 MED ORDER — FLUORESCEIN SODIUM 1 MG OP STRP
2.0000 | ORAL_STRIP | Freq: Once | OPHTHALMIC | Status: DC
  Filled 2015-05-20: qty 2

## 2015-05-20 MED ORDER — HYDROCHLOROTHIAZIDE 25 MG PO TABS
25.0000 mg | ORAL_TABLET | Freq: Every day | ORAL | Status: DC
  Administered 2015-05-20 – 2015-05-22 (×3): 25 mg via ORAL
  Filled 2015-05-20 (×3): qty 1

## 2015-05-20 MED ORDER — ACYCLOVIR 400 MG PO TABS
400.0000 mg | ORAL_TABLET | Freq: Two times a day (BID) | ORAL | Status: DC
  Administered 2015-05-20 – 2015-05-22 (×4): 400 mg via ORAL
  Filled 2015-05-20 (×7): qty 1

## 2015-05-20 MED ORDER — ONDANSETRON HCL 4 MG/2ML IJ SOLN: 4.0000 mg | Freq: Four times a day (QID) | INTRAMUSCULAR | Status: DC | PRN

## 2015-05-20 NOTE — ED Notes (Signed)
She states she had the sensation of something in her eyes last night that resolved after she rubbed her eyes then she woke this am and noticed swelling to bilateral eyes.  She has noticed tightness in her throat when swallowing over past hours. She also has had some pain in her chest since this morning. She denies using any new medications or products. Denies trouble breathing. A&Ox4, resp e/u

## 2015-05-20 NOTE — H&P (Signed)
Family Medicine Teaching [facility] Admission History and Physical Service Pager: [phone]  Patient name: Janice Santos Medical record number: [id number] Date of birth: [birth year] Age: 50 y.o. Gender: female  Primary Care Provider: No primary care provider on file.   [employer] Consultants: None Code Status: FULL (discussed on admission)  Chief Complaint: eye swelling, chest pain  Assessment and Plan: Janice Santos is a 50 y.o. female presenting with chest pain. PMH is significant for HTN, DM, MDD, reported HLD, h/o alcohol abuse, and IBS.   Chest pain: Most likely stable angina from her description. HEART Score 4 for age, EKG, and RFs. EKG unchanged. istat troponin negative. Does not sound like GERD. Wells score 0 (no longer tachycardic). CXR clear, no cough, afebrile. Patient with a psychiatric history however this does not sound like anxiety induced. - place in observation, attending Dr. Deirdre Priest - trend troponins.  - repeat EKG in AM  - may benefit from stress test given exertional component, consider cardiology consult in the AM if still symptomatic.  Conjunctivitis: Given this in addition to dry, tight throat with issues swallowing saliva, there is a concern for angioedema. This may have also been secondary to irritation from rubbing her eyes. Appears to have improved since admission.  Fluorescein stain negative. Less likely preseptal cellulitis. Given the mild nature, does not appear like acute glaucoma. - hold lisinopril  - continue polytrim eye drops to evaluate for improvement.  - continue to monitor for symptoms.   HTN: at goal currently. - given concerns for angioedema holding lisinopril  - continue HCTZ  - continue to monitor   Alcohol abuse: history of significant withdrawal. Denies alcohol use since 4/16. - will place on CIWA to monitor.   Major depression: stable  - continue Prozac, Topamax, amitriptyline   Diabetes mellitus: on metformin. Last A1c  6.2 - start sensitive sliding scale insulin  - CBGs with meals, if continues to be low, consider discontinuing this.    IBS: stable  - continue home Linzess and Amitiza  FEN/GI: IV saline lock, heart healthy carb modified  Prophylaxis: SQ lovenox   Disposition: place in observation on telemetry, attending Dr. Deirdre Priest  History of Present Illness:  Janice Santos is a 50 y.o. female presenting with concern for bilateral eye swelling and chest pain.  Eye swelling/dry throat: Last night she felt like she had something in her left eye so she rubbed her eyes. it they felt ok and she went to sleep. This morning she woke up and looked in the mirror and noticed that her top eyelid was swollen on the left. Also felt like she had swelling bilaterally underneath eyes and across bridge of nose. She took some benadryl. Then felt like she had some throat discomfort and difficulty swallowing. Having difficulty swallowing her saliva and her throat feels dry. Has not eaten anything so unsure about dysphagia. Eyes have been watery. Crusting on eyes this morning. Notes some mild photophobia.  Denies rhinorrhea or postnasal drip, sore throat, cough, or sneezing.  No past history of allergies. No swelling in lips. Has been on lisinopril for 7-8 months. Grandkids have been sick with URI symptoms but no eye infections.   Chest pain: Took a shower this AM and then felt "squeezing" in the middle of her chest. Went to sit down and after resting for a few minutes the pain resolved. Since then, she has noticed continued exertional chest pain (squeezing) that resolves with rest. No pain with rest.   Admits to diaphoresis  associated with chest pain. Pain did not radiate to arms or jaw. Denies N/V, numbness/tingling, dizziness. No LE edema or SOB. Never had any similar chest pain before. No echo, heart procedures. No cardiologist.   In the ED, she was given pepcid, a 1L NS bolus, ASA , and Benadryl. Her eye swelling has  improved. She had negative fluorescein stain and visual acuity of 20/80 (howver does not have glasses).   Review Of Systems: Per HPI with the following additions: none Otherwise the remainder of the systems were negative.  Patient Active Problem List   Diagnosis Date Noted  . Alcoholism (HCC)   . Diabetes mellitus without complication (HCC)   . GERD (gastroesophageal reflux disease)   . Hypertension   . Gastroesophageal reflux disease without esophagitis   . Major depressive disorder (HCC) 04/25/2013  . PTSD (post-traumatic stress disorder) 04/25/2013  . Alcohol dependence (HCC) 04/24/2013  . Alcohol withdrawal (HCC) 04/24/2013    Past Medical History: Past Medical History  Diagnosis Date  . Alcoholism (HCC)   . Diabetes mellitus without complication (HCC)   . GERD (gastroesophageal reflux disease)   . Migraines   . Hypertension     Past Surgical History: Past Surgical History  Procedure Laterality Date  . Gastric bypass    . Abdominal hysterectomy    . Cholecystectomy    . Esophagogastroduodenoscopy (egd) with propofol N/A 03/26/2015    Procedure: ESOPHAGOGASTRODUODENOSCOPY (EGD) WITH PROPOFOL;  Surgeon: Iva Boop, MD;  Location: [facility];  Service: Endoscopy;  Laterality: N/A;    Social History: Social History  Substance Use Topics  . Smoking status: Never Smoker   . Smokeless tobacco: None  . Alcohol Use: No     Comment: recovering alcoholic- last drink 06/2014   Additional social history: Does not smoke cigarettes. Does not drink any alcohol. Last drink 06/2014 (used to abuse alcohol). No use of illicit drugs.   Please also refer to relevant sections of EMR.  Family History: Family History  Problem Relation Age of Onset  . Diabetes Mother   . Hypertension Mother   . Diabetes Father   . Hypertension Father   . Diabetes Brother   . Hypertension Brother   . Diabetes Sister    Gearldine Shown and grandfather died from complications of CHF.  Extensive  family history of DM and HTN.  Allergies and Medications: Allergies  Allergen Reactions  . Morphine And Related Hives and Itching  . Sulfa Antibiotics Hives and Itching   No current facility-administered medications on file prior to encounter.   Current Outpatient Prescriptions on File Prior to Encounter  Medication Sig Dispense Refill  . acyclovir (ZOVIRAX) 400 MG tablet Take 400 mg by mouth 2 (two) times daily.    Marland Kitchen amitriptyline (ELAVIL) 25 MG tablet Take 50 mg by mouth at bedtime.    . Cholecalciferol (VITAMIN D3) 5000 UNITS TABS Take 1 tablet by mouth daily.    . Cyanocobalamin (B-12) 2000 MCG TABS Take 4,000 mcg by mouth daily.    Marland Kitchen FLUoxetine (PROZAC) 40 MG capsule Take 40 mg by mouth daily.  2  . lisinopril-hydrochlorothiazide (PRINZIDE,ZESTORETIC) 10-12.5 MG tablet Take 1 tablet by mouth daily.    . metFORMIN (GLUCOPHAGE) 500 MG tablet Take 1 tablet (500 mg total) by mouth 3 (three) times daily.    . metoCLOPramide (REGLAN) 5 MG tablet Take 1 tablet (5 mg total) by mouth 4 (four) times daily. 42 tablet 0  . Multiple Vitamin (MULTIVITAMIN WITH MINERALS) TABS tablet Take 1 tablet  by mouth daily.    Marland Kitchen. topiramate (TOPAMAX) 100 MG tablet TAKE 1 TABLET (100 MG TOTAL) BY MOUTH 2 TIMES DAILY.  1  . traMADol (ULTRAM) 50 MG tablet Take 2 tablets (100 mg total) by mouth every 6 (six) hours as needed. (Patient taking differently: Take 100 mg by mouth every 6 (six) hours as needed for moderate pain. ) 20 tablet 0  . omeprazole (PRILOSEC) 20 MG capsule Take 1 capsule (20 mg total) by mouth daily. (Patient not taking: Reported on 12/09/2014) 30 capsule 1    Objective: BP 138/92 mmHg  Pulse 87  Temp(Src) 98.2 F (36.8 C) (Oral)  Resp 18  SpO2 100% Exam: General: Lying in bed in NAD playing on her cell phone. Non-toxic. Pleasant  Eyes: Conjunctivae mildly injected bilaterally with mild upper lid swelling. PERRL. EOMI with no nystagmus or vision change, noted mild discomfort with lateral  gaze bilaterally.  ENTM: Moist mucous membranes. Oropharynx clear. No nasal discharge.  Neck: Supple, no LAD Cardiovascular: RRR. No murmurs, rubs, or gallops noted. Patient notes some pain with palpation over the anterior chest wall but notes that it does not reproduce her squeezing pain.  No pitting edema noted. Respiratory: No increased WOB. CTAB without wheezing, rhonchi, or crackles noted. Abdomen: +BS, soft, non-distended, non-tender.  MSK: Normal bulk and tone noted. No gross deformities noted.  Skin: No rashes noted  Neuro: A&O x4. No gross neurologic deficits  Psych:  Appropriate mood and affect.    Labs and Imaging: CBC BMET   Recent Labs Lab 05/20/15 1348 05/20/15 1440  WBC 5.6  --   HGB 13.0 14.3  HCT 38.9 42.0  PLT 259  --     Recent Labs Lab 05/20/15 1348 05/20/15 1440  NA 141 143  K 4.0 3.8  CL 111 110  CO2 20*  --   BUN 16 17  CREATININE 0.97 0.90  GLUCOSE 127* 116*  CALCIUM 9.6  --     i stat troponin 0.0  Risk Stratification Labs  TSH    Component Value Date/Time   TSH 1.209 03/25/2015 0525   Hemoglobin A1C    Component Value Date/Time   HGBA1C 6.2* 03/20/2015 0508   Lipid Panel  No results found for: CHOL, TRIG, HDL, CHOLHDL, VLDL, LDLCALC   EKG: sinus tachycardia, HR 113, t wave flattening in V1, no significant change from 1/17.  Dg Chest 2 View  05/20/2015  CLINICAL DATA:  Chest pain and dysphagia EXAM: CHEST  2 VIEW COMPARISON:  March 19, 2015 FINDINGS: There is no edema or consolidation. The heart size and pulmonary vascularity are normal. No adenopathy. No bone lesions. No pneumothorax. IMPRESSION: No edema or consolidation. Electronically Signed   By: Bretta BangWilliam  Woodruff III M.D.   On: 05/20/2015 15:43     Joanna Puffrystal S Jerzee Jerome, MD 05/20/2015, 4:09 PM PGY-2, [facility] FPTS Intern pager: [phone], text pages welcome

## 2015-05-20 NOTE — ED Provider Notes (Signed)
CSN: [id number]     Arrival date & time 05/20/15  1332 History   First MD Initiated Contact with Patient 05/20/15 1402     Chief Complaint  Patient presents with  . Facial Swelling     (Consider location/radiation/quality/duration/timing/severity/associated sxs/prior Treatment) HPI   Blood pressure 115/95, pulse 108, temperature 98.2 F (36.8 C), temperature source Oral, resp. rate 18, SpO2 100 %.  Janice Santos is a 50 y.o. female with past medical history significant for hypertension, non-insulin-dependent diabetes complaining of foreign body sensation in both eyes onset last night as she was reading. Patient rubbed both eyes, went to bed and when she woke up this morning with significantly more swollen and had white opaque discharge. She denies change in her vision but states that they're painful, 7 out of 10. Reports feeling like her throat is tight with difficulty swallowing. She denies rash, nausea, vomiting, wheezing, shortness of breath, new drugs, change in soaps, lotions, detergents, makeup.   Patient also states that she had a episode of chest pain this morning, now completely resolved. Take the pain started this morning proximally 10 AM when she was getting ready putting on her clothes. She describes it as retrosternal, nonradiating, approximately 5 out of 10 with no associated symptoms. She states that it's alleviated with rest. States that it recurs when she becomes more active, states specifically that recurred when she walked from her car to the ED to register. Does not appear to be positional. Cardiac risk factors include non-insulin-dependent diabetes, hypertension, hyperlipidemia (patient endorses this, however, I can find no record), postmenopausal female.  Past Medical History  Diagnosis Date  . Alcoholism (HCC)   . Diabetes mellitus without complication (HCC)   . GERD (gastroesophageal reflux disease)   . Migraines   . Hypertension    Past Surgical History  Procedure  Laterality Date  . Gastric bypass    . Abdominal hysterectomy    . Cholecystectomy    . Esophagogastroduodenoscopy (egd) with propofol N/A 03/26/2015    Procedure: ESOPHAGOGASTRODUODENOSCOPY (EGD) WITH PROPOFOL;  Surgeon: Iva Boop, MD;  Location: [facility];  Service: Endoscopy;  Laterality: N/A;   Family History  Problem Relation Age of Onset  . Diabetes Mother   . Hypertension Mother   . Diabetes Father   . Hypertension Father   . Diabetes Brother   . Hypertension Brother   . Diabetes Sister    Social History  Substance Use Topics  . Smoking status: Never Smoker   . Smokeless tobacco: None  . Alcohol Use: No     Comment: recovering alcoholic- last drink 06/2014   OB History    No data available     Review of Systems  10 systems reviewed and found to be negative, except as noted in the HPI.  Allergies  Morphine and related and Sulfa antibiotics  Home Medications   Prior to Admission medications   Medication Sig Start Date End Date Taking? Authorizing Provider  acyclovir (ZOVIRAX) 400 MG tablet Take 400 mg by mouth 2 (two) times daily.   Yes Historical Provider, MD  amitriptyline (ELAVIL) 25 MG tablet Take 50 mg by mouth at bedtime.   Yes Historical Provider, MD  Cholecalciferol (VITAMIN D3) 5000 UNITS TABS Take 1 tablet by mouth daily.   Yes Historical Provider, MD  Cyanocobalamin (B-12) 2000 MCG TABS Take 4,000 mcg by mouth daily.   Yes Historical Provider, MD  FLUoxetine (PROZAC) 40 MG capsule Take 40 mg by mouth daily. 02/23/15  Yes Historical  Provider, MD  LINZESS 145 MCG CAPS capsule Take 145 mg by mouth daily. 04/20/15  Yes Historical Provider, MD  lisinopril-hydrochlorothiazide (PRINZIDE,ZESTORETIC) 10-12.5 MG tablet Take 1 tablet by mouth daily.   Yes Historical Provider, MD  lubiprostone (AMITIZA) 24 MCG capsule Take 24 mcg by mouth 2 (two) times daily. 04/21/15 05/21/15 Yes Historical Provider, MD  metFORMIN (GLUCOPHAGE) 500 MG tablet Take 1 tablet (500 mg  total) by mouth 3 (three) times daily. 04/28/13  Yes Thermon Leyland, NP  metoCLOPramide (REGLAN) 5 MG tablet Take 1 tablet (5 mg total) by mouth 4 (four) times daily. 03/26/15  Yes Simbiso Ranga, MD  Multiple Vitamin (MULTIVITAMIN WITH MINERALS) TABS tablet Take 1 tablet by mouth daily.   Yes Historical Provider, MD  topiramate (TOPAMAX) 100 MG tablet TAKE 1 TABLET (100 MG TOTAL) BY MOUTH 2 TIMES DAILY. 03/03/15  Yes Historical Provider, MD  traMADol (ULTRAM) 50 MG tablet Take 2 tablets (100 mg total) by mouth every 6 (six) hours as needed. Patient taking differently: Take 100 mg by mouth every 6 (six) hours as needed for moderate pain.  03/26/15  Yes Simbiso Ranga, MD  omeprazole (PRILOSEC) 20 MG capsule Take 1 capsule (20 mg total) by mouth daily. Patient not taking: Reported on 12/09/2014 06/02/13   Kristen N Ward, DO   BP 138/92 mmHg  Pulse 87  Temp(Src) 98.2 F (36.8 C) (Oral)  Resp 18  SpO2 100% Physical Exam  Constitutional: She is oriented to person, place, and time. She appears well-developed and well-nourished. No distress.  HENT:  Head: Normocephalic.  Mouth/Throat: Oropharynx is clear and moist.  Bilateral conjunctival injection with very mild upper eyelid swelling, pupils equal round and reactive to light. There is no abnormal uptake on fluorescein stain. EOMI intact without pain or diplopia.  Eyes: Pupils are equal, round, and reactive to light.  Neck: Normal range of motion. No JVD present. No tracheal deviation present.  Cardiovascular: Normal rate, regular rhythm and intact distal pulses.   Radial pulse equal bilaterally  Pulmonary/Chest: Effort normal and breath sounds normal. No stridor. No respiratory distress. She has no wheezes. She has no rales. She exhibits no tenderness.  Abdominal: Soft. She exhibits no distension and no mass. There is no tenderness. There is no rebound and no guarding.  Musculoskeletal: Normal range of motion. She exhibits no edema or tenderness.  No  calf asymmetry, superficial collaterals, palpable cords, edema, Homans sign negative bilaterally.    Neurological: She is alert and oriented to person, place, and time.  Skin: Skin is warm. She is not diaphoretic.  Psychiatric: She has a normal mood and affect.  Nursing note and vitals reviewed.   ED Course  Procedures (including critical care time) Labs Review Labs Reviewed  BASIC METABOLIC PANEL - Abnormal; Notable for the following:    CO2 20 (*)    Glucose, Bld 127 (*)    All other components within normal limits  I-STAT CHEM 8, ED - Abnormal; Notable for the following:    Glucose, Bld 116 (*)    All other components within normal limits  CBC  I-STAT TROPOININ, ED    Imaging Review Dg Chest 2 View  05/20/2015  CLINICAL DATA:  Chest pain and dysphagia EXAM: CHEST  2 VIEW COMPARISON:  March 19, 2015 FINDINGS: There is no edema or consolidation. The heart size and pulmonary vascularity are normal. No adenopathy. No bone lesions. No pneumothorax. IMPRESSION: No edema or consolidation. Electronically Signed   By: Bretta Bang III  M.D.   On: 05/20/2015 15:43   I have personally reviewed and evaluated these images and lab results as part of my medical decision-making.   EKG Interpretation   Date/Time:  Thursday May 20 2015 13:37:54 EDT Ventricular Rate:  113 PR Interval:  166 QRS Duration: 88 QT Interval:  344 QTC Calculation: 471 R Axis:   8 Text Interpretation:  Sinus tachycardia Septal infarct , age undetermined  No significant change since last tracing Confirmed by [facility] CenterLUNKETT  MD,  WHITNEY ([id number]) on 05/20/2015 2:55:58 PM      MDM   Final diagnoses:  Bilateral conjunctivitis  Chest pain, unspecified chest pain type    Filed Vitals:   05/20/15 1340 05/20/15 1400 05/20/15 1445  BP: 115/95 130/95 138/92  Pulse: 108 109 87  Temp: 98.2 F (36.8 C)    TempSrc: Oral    Resp: 18    SpO2: 100% 100% 100%    Medications  fluorescein ophthalmic strip 2 strip  (not administered)  tetracaine (PONTOCAINE) 0.5 % ophthalmic solution 2 drop (not administered)  aspirin chewable tablet 324 mg (not administered)  trimethoprim-polymyxin b (POLYTRIM) ophthalmic solution 1 drop (not administered)  sodium chloride 0.9 % bolus 1,000 mL (1,000 mLs Intravenous New Bag/Given 05/20/15 1429)  diphenhydrAMINE (BENADRYL) injection 25 mg (25 mg Intravenous Given 05/20/15 1429)  famotidine (PEPCID) IVPB 20 mg premix (20 mg Intravenous New Bag/Given 05/20/15 1429)    Janice Selleronya Luten is 50 y.o. female presenting with Eye irritation and lid swelling consistent with irritation secondary to rubbing her eyes from the conjunctivitis. Visual acuity is 20/80 bilaterally (uncorrected), there is no abnormal uptake on fluorescein stain. I doubt this is a preseptal cellulitis. Concerning I, patient notes exertional chest pain. EKG with no ischemic changes. Blood work is reassuring however this is a non-insulin-dependent diabetic, hypertensive, hyperlipidemia postmenopausal female multiple cardiac risk factors. She's moderate risk by heart score and will need admission for chest pain rule out.  Unassigned admission to family practice to attending Dr. Deirdre Priesthambliss. Discussed with Resident Dr. Marland KitchenWallis   Carvin Almas, PA-C 05/20/15 1622  Gwyneth SproutWhitney Plunkett, MD 05/28/15 2140

## 2015-05-21 DIAGNOSIS — E119 Type 2 diabetes mellitus without complications: Secondary | ICD-10-CM | POA: Diagnosis not present

## 2015-05-21 DIAGNOSIS — I10 Essential (primary) hypertension: Secondary | ICD-10-CM | POA: Diagnosis not present

## 2015-05-21 DIAGNOSIS — R072 Precordial pain: Secondary | ICD-10-CM

## 2015-05-21 DIAGNOSIS — R000 Tachycardia, unspecified: Secondary | ICD-10-CM | POA: Diagnosis not present

## 2015-05-21 DIAGNOSIS — R634 Abnormal weight loss: Secondary | ICD-10-CM | POA: Diagnosis not present

## 2015-05-21 DIAGNOSIS — R079 Chest pain, unspecified: Secondary | ICD-10-CM | POA: Diagnosis not present

## 2015-05-21 LAB — TROPONIN I: Troponin I: <0.03 ng/mL (ref <0.031)

## 2015-05-21 LAB — GLUCOSE, CAPILLARY
GLUCOSE-CAPILLARY: 114 mg/dL — AB (ref 65–99)
GLUCOSE-CAPILLARY: 74 mg/dL (ref 65–99)
Glucose-Capillary: 82 mg/dL (ref 65–99)

## 2015-05-21 LAB — TSH: TSH: 1.202 u[IU]/mL (ref 0.350–4.500)

## 2015-05-21 MED ORDER — METOPROLOL TARTRATE 25 MG PO TABS
25.0000 mg | ORAL_TABLET | Freq: Two times a day (BID) | ORAL | Status: DC
  Administered 2015-05-21 – 2015-05-22 (×2): 25 mg via ORAL
  Filled 2015-05-21 (×2): qty 1

## 2015-05-21 MED ORDER — BUTALBITAL-APAP-CAFFEINE 50-325-40 MG PO TABS
1.0000 | ORAL_TABLET | Freq: Once | ORAL | Status: AC
  Administered 2015-05-21: 1 via ORAL
  Filled 2015-05-21: qty 1

## 2015-05-21 MED ORDER — METOPROLOL TARTRATE 12.5 MG HALF TABLET
12.5000 mg | ORAL_TABLET | Freq: Two times a day (BID) | ORAL | Status: DC
  Administered 2015-05-21: 12.5 mg via ORAL
  Filled 2015-05-21: qty 1

## 2015-05-21 NOTE — Progress Notes (Signed)
Call received from central tele that pt's heart rate in 140's.  Checked on pt and noted pt at side of bed straightening up her bed.  Asked if she was ok said.  " I just came from the bathroom and trying to get back in it.  Finish making bed up for pt and assisted her back to bed.  BP 105/94, P 128.  Dr. Delton SeeNelson just walked in pt's room and notified her.  MD instructed she will put in orders.  Made primary nurse aware,  Will continue to monitor.  Mahreen Schewe,RN.

## 2015-05-21 NOTE — Discharge Summary (Signed)
Family Medicine Teaching [facility] Discharge Summary  Patient name: Janice Santos Medical record number: [id number] Date of birth: 09/07/1965 Age: 50 y.o. Gender: female Date of Admission: 05/20/2015  Date of Discharge: 05/22/2015 Admitting Physician: Carney LivingMarshall L Chambliss, MD  Primary Care Provider: No primary care provider on file. Consultants: Cardiology  Indication for Hospitalization: Chest Pain and Probable Angioedema   Discharge Diagnoses/Problem List:  Principal Problem:   Chest pain Active Problems:   Bilateral conjunctivitis   Essential hypertension   Type 2 diabetes mellitus without complication, without long-term current use of insulin (HCC)   Sinus tachycardia (HCC)   Disposition: Home   Discharge Condition: Improved   Discharge Exam: Blood pressure 140/84, pulse 76, temperature 97.4 F (36.3 C), temperature source Oral, resp. rate 18, height 5\' 10"  (1.778 m), weight 218 lb 6.4 oz (99.066 kg), SpO2 98 %. (Performed by resident physician Dr. Mickie HillierIan McKeag) General: Lying in bed resting comfortably, non-toxic appearing  HEENT: Conjunctivae not injected. EOMI with no discomfort. No facial swelling.  Cardiovascular: RRR. No murmurs appreciated. No pitting edema.  Respiratory: CTAB. Normal WOB.  Abdomen: +BS, soft, NTND   Brief Hospital Course:  Janice Santos is 50 y.o. with PMH significant for HTN, T2DM, MDD, reported HLD, h/o EtOH abuse, and IBS who presented with facial swelling and chest pain.   Chest Pain: From patient's description likely stable angina. EKG was without ST segment changes and troponins were negative x4. Cardiology was consulted and recommended Lexiscan-Myoview stress test. This was performed 05/22/15 with low-risk findings, showing no reversible ischemia or infarction. EF on stress test was normal at 56%. However, complete ECHO results were pending at time of discharge. Cardiology recommended continuation of low dose beta blocker but no further  cardiac work-up.  Probable Angioedema: Patient reports being started on Lisinopril within the past year. Presented with facial swelling and sensation of tight throat. Concern for angioedema given sudden swelling in context of AceI use. Lisinopril was held at time of admission. Swelling and throat sensation resolved during hospital course. Patient's eyes were mildly injected bilaterally at time of admission.  Flouroscein stain was negative. Redness likely related to irritation as patient reported rubbing her eyes, and redness had improved at discharge.   Issues for Follow Up:  1. Will not continue Lisinopril at discharge given possible angioedema. This patient's reaction was mild and will defer to PCP regarding initiating ARB in its place.  2. Have discharged patient with daily ASA given age and risk factors.  3. Will defer to PCP regarding ASCVD risk assessment and possible statin therapy.  4. Blood pressure and heart rate on metoprolol.  5. Final ECHO report.   Significant Procedures: Echocardiogram  Significant Labs and Imaging:   Recent Labs Lab 05/20/15 1348 05/20/15 1440  WBC 5.6  --   HGB 13.0 14.3  HCT 38.9 42.0  PLT 259  --     Recent Labs Lab 05/20/15 1348 05/20/15 1440  NA 141 143  K 4.0 3.8  CL 111 110  CO2 20*  --   GLUCOSE 127* 116*  BUN 16 17  CREATININE 0.97 0.90  CALCIUM 9.6  --     Dg Chest 2 View  05/20/2015  CLINICAL DATA:  Chest pain and dysphagia EXAM: CHEST  2 VIEW COMPARISON:  March 19, 2015 FINDINGS: There is no edema or consolidation. The heart size and pulmonary vascularity are normal. No adenopathy. No bone lesions. No pneumothorax. IMPRESSION: No edema or consolidation. Electronically Signed   By: Chrissie NoaWilliam  Margarita Grizzle III M.D.   On: 05/20/2015 15:43   Nm Myocar Multi W/spect W/wall Motion / Ef  05/22/2015  CLINICAL DATA:  50 year old with chest pain. EXAM: MYOCARDIAL IMAGING WITH SPECT (REST AND PHARMACOLOGIC-STRESS) GATED LEFT VENTRICULAR WALL  MOTION STUDY LEFT VENTRICULAR EJECTION FRACTION TECHNIQUE: Standard myocardial SPECT imaging was performed after resting intravenous injection of 10 mCi Tc-34m sestamibi. Subsequently, intravenous infusion of Lexiscan was performed under the supervision of the Cardiology staff. At peak effect of the drug, 30 mCi Tc-69m sestamibi was injected intravenously and standard myocardial SPECT imaging was performed. Quantitative gated imaging was also performed to evaluate left ventricular wall motion, and estimate left ventricular ejection fraction. COMPARISON:  None. FINDINGS: Perfusion: No decreased activity in the left ventricle on stress imaging to suggest reversible ischemia or infarction. Wall Motion: Normal left ventricular wall motion. No left ventricular dilation. Left Ventricular Ejection Fraction: 56 % End diastolic volume 86 ml End systolic volume 38 ml IMPRESSION: 1. No reversible ischemia or infarction. 2. Normal left ventricular wall motion. 3. Left ventricular ejection fraction is 56%. 4. Low-risk stress test findings*. *2012 Appropriate Use Criteria for Coronary Revascularization Focused Update: J Am Coll Cardiol. 2012;59(9):857-881. http://content.dementiazones.com.aspx?articleid=[email or web address] Electronically Signed   By: Richarda Overlie M.D.   On: 05/22/2015 13:49    Results/Tests Pending at Time of Discharge: ECHO  Discharge Medications:    Medication List    STOP taking these medications        lisinopril-hydrochlorothiazide 10-12.5 MG tablet  Commonly known as:  PRINZIDE,ZESTORETIC     omeprazole 20 MG capsule  Commonly known as:  PRILOSEC      TAKE these medications        acyclovir 400 MG tablet  Commonly known as:  ZOVIRAX  Take 400 mg by mouth 2 (two) times daily.     amitriptyline 25 MG tablet  Commonly known as:  ELAVIL  Take 50 mg by mouth at bedtime.     B-12 2000 MCG Tabs  Take 4,000 mcg by mouth daily.     FLUoxetine 40 MG capsule  Commonly known as:  PROZAC  Take 40  mg by mouth daily.     hydrochlorothiazide 25 MG tablet  Commonly known as:  HYDRODIURIL  Take 1 tablet (25 mg total) by mouth daily.     LINZESS 145 MCG Caps capsule  Generic drug:  Linaclotide  Take 145 mg by mouth daily.     lubiprostone 24 MCG capsule  Commonly known as:  AMITIZA  Take 1 capsule (24 mcg total) by mouth 2 (two) times daily.     metFORMIN 500 MG tablet  Commonly known as:  GLUCOPHAGE  Take 1 tablet (500 mg total) by mouth 3 (three) times daily.     metoCLOPramide 5 MG tablet  Commonly known as:  REGLAN  Take 1 tablet (5 mg total) by mouth 4 (four) times daily.     metoprolol tartrate 25 MG tablet  Commonly known as:  LOPRESSOR  Take 1 tablet (25 mg total) by mouth 2 (two) times daily.     multivitamin with minerals Tabs tablet  Take 1 tablet by mouth daily.     topiramate 100 MG tablet  Commonly known as:  TOPAMAX  TAKE 1 TABLET (100 MG TOTAL) BY MOUTH 2 TIMES DAILY.     traMADol 50 MG tablet  Commonly known as:  ULTRAM  Take 2 tablets (100 mg total) by mouth every 6 (six) hours as needed.     Vitamin D3 5000 units  Tabs  Take 1 tablet by mouth daily.        Discharge Instructions: Please refer to Patient Instructions section of EMR for full details.  Patient was counseled important signs and symptoms that should prompt return to medical care, changes in medications, dietary instructions, activity restrictions, and follow up appointments.   Follow-Up Appointments: Follow-up Information    Schedule an appointment as soon as possible for a visit with Thomes Dinning, MD.   Specialty:  Internal Medicine   Why:  for hospital follow-up   Contact information:   [address] [facility] [place] [address] [phone]       Casey Burkitt, MD 05/22/2015, 4:04 PM PGY-1, [facility]

## 2015-05-21 NOTE — Progress Notes (Signed)
Family Medicine Teaching Service Daily Progress Note Intern Pager: [phone]  Patient name: Janice Santos Medical record number: [id number] Date of birth: 1965-09-23 Age: 50 y.o. Gender: female  Primary Care Provider: No primary care provider on file. Consultants: Cardiology Code Status: FULL   Pt Overview and Major Events to Date:  3/16: Patient admitted for ACS r/o   Assessment and Plan: Janice Santos is a 50 y.o. female presenting with chest pain. PMH is significant for HTN, DM, MDD, reported HLD, h/o alcohol abuse, and IBS.   Chest pain: Most likely stable angina from her description. HEART Score 4 for age, EKG, and RFs. EKG unchanged. istat troponin negative. Does not sound like GERD. Wells score 0 (no longer tachycardic). CXR clear, no cough, afebrile. Patient with a psychiatric history however this does not sound like anxiety induced. - place in observation, attending Dr. Deirdre Priest - trend troponins: <0.03 x3  - repeat EKG in AM  - may benefit from stress test given exertional component, most likely outpatient  -patient may benefit from daily ASA given age and risk factors  -patient reports history of hyperlipidemia, but states it was mild enough to not require medications; lipid panel not available in our EMR; will recommend ASCVD risk calculation and possible statin to PCP     Conjunctivitis: Given this in addition to dry, tight throat with issues swallowing saliva, there is a concern for angioedema. This may have also been secondary to irritation from rubbing her eyes. Appears to have improved since admission. Fluorescein stain negative. Less likely preseptal cellulitis. Given the mild nature, does not appear like acute glaucoma. - hold lisinopril  - continue polytrim eye drops to evaluate for improvement.  - continue to monitor for symptoms.   HTN: at goal currently. - given concerns for angioedema holding lisinopril  - continue HCTZ  - continue to monitor   Migraine:  Patient with h/o migraines. With migraine on morning of 3/17. Not improved with Tylenol.  -have continued Topamax  -holding NSAIDs given ACS r/o  -Fioricet given    Alcohol abuse: history of significant withdrawal. Denies alcohol use since 4/16. - will place on CIWA to monitor: scores of 0  -will d/c CIWA later today   Major depression: stable  - continue Prozac, Topamax, amitriptyline   Diabetes mellitus: on metformin. Last A1c 6.2 - start sensitive sliding scale insulin  - CBGs with meals, if continues to be low, consider discontinuing this.    IBS: stable  - continue home Linzess and Amitiza  FEN/GI: IV saline lock, heart healthy carb modified  Prophylaxis: SQ lovenox   Disposition: Home pending cardiology clearance   Subjective:  Has a migraine this morning, but otherwise feeling well. Reports facial swelling is much improved. No further throat discomfort. Has not had any episodes of chest squeezing but has not been exerting herself either.   Objective: Temp:  [98 F (36.7 C)-98.4 F (36.9 C)] 98.2 F (36.8 C) (03/17 0438) Pulse Rate:  [80-109] 80 (03/17 0438) Resp:  [18] 18 (03/17 0438) BP: (111-159)/(74-95) 119/74 mmHg (03/17 0438) SpO2:  [95 %-100 %] 100 % (03/17 0438) Weight:  [221 lb 3.2 oz (100.336 kg)-221 lb 9.6 oz (100.517 kg)] 221 lb 3.2 oz (100.336 kg) (03/17 0438) Physical Exam: General: lying in bed resting comfortably, non-toxic appearing  HEENT: Conjunctivae not injected. EOMI with no discomfort. No facial swelling.  Cardiovascular: RRR. No murmurs appreciated. No TTP over anterior chest wall. No pitting edema.  Respiratory: CTAB. Normal WOB.  Abdomen: +BS,  soft, NTND    Laboratory:  Recent Labs Lab 05/20/15 1348 05/20/15 1440  WBC 5.6  --   HGB 13.0 14.3  HCT 38.9 42.0  PLT 259  --     Recent Labs Lab 05/20/15 1348 05/20/15 1440  NA 141 143  K 4.0 3.8  CL 111 110  CO2 20*  --   BUN 16 17  CREATININE 0.97 0.90  CALCIUM 9.6  --    GLUCOSE 127* 116*     Imaging/Diagnostic Tests: EKG: sinus tachycardia, HR 113, t wave flattening in V1, no significant change from 1/17.  Dg Chest 2 View  05/20/2015 CLINICAL DATA: Chest pain and dysphagia EXAM: CHEST 2 VIEW COMPARISON: March 19, 2015 FINDINGS: There is no edema or consolidation. The heart size and pulmonary vascularity are normal. No adenopathy. No bone lesions. No pneumothorax. IMPRESSION: No edema or consolidation. Electronically Signed By: Bretta BangWilliam Woodruff III M.D. On: 05/20/2015 15:43   Janice Marketatherine Lauren Wallace, DO 05/21/2015, 9:17 AM PGY-1, [facility] FPT[facility] Intern pager: [phone], text pages welcome

## 2015-05-21 NOTE — Consult Note (Signed)
CARDIOLOGY CONSULT NOTE   Patient ID: Janice Santos MRN: [id number] DOB/AGE: 51/03/1965 49 y.o.  Admit date: 05/20/2015  Primary Physician   No primary care provider on file. Primary Cardiologist   New Reason for Consultation   Chest pain Requesting Physician  Dr. Deirdre Priesthambliss  HPI: Janice Selleronya Starzyk is a 50 y.o. female with a history of DM, HTN, diet controlled HL, depression  migraines and GERD presented with Chest pain.   Yesterday morning after getting out of the shower patient developed a sudden onset of chest pain. The pain is at substernal area and described at "squeezing". The pain was associated with diaphoresis which resolved with rest within 5-10 minutes. However again reoccurred with minimal  Exertion x 4-5 and resolved with rest. Family brought her to the ED for further evaluation. He continued to have intermittent chest pain with minimal exertion. She also has some difficulty with swallowing at that time. Denies shortness of breath, nausea or vomiting. Patient also have some redness around ger eye. Admits to having palpitation during episode of chest pain. Denies lower extremity edema, dizziness, orthopnea, PND, syncope, blood in the  her stool or urine. Recently has being having issue with constipation.   EKG shows normal sinus rhythm with tachycardia at rate above 110. Troponin negative x 3. Chest x-ray clear. Lytes normal. Currently chest pain-free. Maternal grand father and mother has a CHF. Denies history of tobacco smoking or illicit drug use. The patient states that she had a history of skipping heart beat during her teenage years. Never evaluated. No family history of congenital heart disease or premature death. Telemetry shows sinus tachycardia at rate up to 140s.   Past Medical History  Diagnosis Date  . Alcoholism (HCC)   . Diabetes mellitus without complication (HCC)   . GERD (gastroesophageal reflux disease)   . Migraines   . Hypertension      Past Surgical History    Procedure Laterality Date  . Gastric bypass    . Abdominal hysterectomy    . Cholecystectomy    . Esophagogastroduodenoscopy (egd) with propofol N/A 03/26/2015    Procedure: ESOPHAGOGASTRODUODENOSCOPY (EGD) WITH PROPOFOL;  Surgeon: Iva Booparl E Gessner, MD;  Location: [facility] ENDOSCOPY;  Service: Endoscopy;  Laterality: N/A;    Allergies  Allergen Reactions  . Morphine And Related Hives and Itching  . Sulfa Antibiotics Hives and Itching    I have reviewed the patient's current medications . acyclovir  400 mg Oral BID  . amitriptyline  50 mg Oral QHS  . enoxaparin (LOVENOX) injection  40 mg Subcutaneous Q24H  . FLUoxetine  40 mg Oral Daily  . folic acid  1 mg Oral Daily  . hydrochlorothiazide  25 mg Oral Daily  . insulin aspart  0-5 Units Subcutaneous QHS  . insulin aspart  0-9 Units Subcutaneous TID WC  . Linaclotide  145 mcg Oral Daily  . lubiprostone  24 mcg Oral BID  . multivitamin with minerals  1 tablet Oral Daily  . thiamine  100 mg Oral Daily  . topiramate  100 mg Oral BID  . trimethoprim-polymyxin b  1 drop Both Eyes 6 times per day     acetaminophen, diphenhydrAMINE, LORazepam **OR** LORazepam, ondansetron (ZOFRAN) IV  Prior to Admission medications   Medication Sig Start Date End Date Taking? Authorizing Provider  acyclovir (ZOVIRAX) 400 MG tablet Take 400 mg by mouth 2 (two) times daily.   Yes Historical Provider, MD  amitriptyline (ELAVIL) 25 MG tablet Take 50 mg by mouth at bedtime.  Yes Historical Provider, MD  Cholecalciferol (VITAMIN D3) 5000 UNITS TABS Take 1 tablet by mouth daily.   Yes Historical Provider, MD  Cyanocobalamin (B-12) 2000 MCG TABS Take 4,000 mcg by mouth daily.   Yes Historical Provider, MD  FLUoxetine (PROZAC) 40 MG capsule Take 40 mg by mouth daily. 02/23/15  Yes Historical Provider, MD  LINZESS 145 MCG CAPS capsule Take 145 mg by mouth daily. 04/20/15  Yes Historical Provider, MD  lisinopril-hydrochlorothiazide (PRINZIDE,ZESTORETIC) 10-12.5 MG  tablet Take 1 tablet by mouth daily.   Yes Historical Provider, MD  lubiprostone (AMITIZA) 24 MCG capsule Take 24 mcg by mouth 2 (two) times daily. 04/21/15 05/21/15 Yes Historical Provider, MD  metFORMIN (GLUCOPHAGE) 500 MG tablet Take 1 tablet (500 mg total) by mouth 3 (three) times daily. 04/28/13  Yes Thermon Leyland, NP  metoCLOPramide (REGLAN) 5 MG tablet Take 1 tablet (5 mg total) by mouth 4 (four) times daily. 03/26/15  Yes Simbiso Ranga, MD  Multiple Vitamin (MULTIVITAMIN WITH MINERALS) TABS tablet Take 1 tablet by mouth daily.   Yes Historical Provider, MD  topiramate (TOPAMAX) 100 MG tablet TAKE 1 TABLET (100 MG TOTAL) BY MOUTH 2 TIMES DAILY. 03/03/15  Yes Historical Provider, MD  traMADol (ULTRAM) 50 MG tablet Take 2 tablets (100 mg total) by mouth every 6 (six) hours as needed. Patient taking differently: Take 100 mg by mouth every 6 (six) hours as needed for moderate pain.  03/26/15  Yes Simbiso Ranga, MD  omeprazole (PRILOSEC) 20 MG capsule Take 1 capsule (20 mg total) by mouth daily. Patient not taking: Reported on 12/09/2014 06/02/13   Layla Maw Ward, DO     Social History   Social History  . Marital Status: Legally Separated    Spouse Name: N/A  . Number of Children: N/A  . Years of Education: N/A   Occupational History  . Not on file.   Social History Main Topics  . Smoking status: Never Smoker   . Smokeless tobacco: Not on file  . Alcohol Use: No     Comment: recovering alcoholic- last drink 06/2014  . Drug Use: No  . Sexual Activity: No   Other Topics Concern  . Not on file   Social History Narrative   ** Merged History Encounter **        No family status information on file.   Family History  Problem Relation Age of Onset  . Diabetes Mother   . Hypertension Mother   . Diabetes Father   . Hypertension Father   . Diabetes Brother   . Hypertension Brother   . Diabetes Sister      ROS:  Full 14 point review of systems complete and found to be negative  unless listed above.  Physical Exam: Blood pressure 119/74, pulse 80, temperature 98.2 F (36.8 C), temperature source Oral, resp. rate 18, height  (1.778 m), weight 221 lb 3.2 oz (100.336 kg), SpO2 100 %.  General: Well developed, well nourished, female in no acute distress Head: Eyes PERRLA, No xanthomas. Normocephalic and atraumatic, oropharynx without edema or exudate.  Lungs: Resp regular and unlabored, CTA. Heart: Regular rhythm with tachycardia no s3, s4, or murmurs..   Neck: No carotid bruits. No lymphadenopathy. No  JVD. Abdomen: Bowel sounds present, abdomen soft and non-tender without masses or hernias noted. Msk:  No spine or cva tenderness. No weakness, no joint deformities or effusions. Extremities: No clubbing, cyanosis or edema. DP/PT/Radials 2+ and equal bilaterally. Neuro: Alert and oriented X  3. No focal deficits noted. Psych:  Good affect, responds appropriately Skin: No rashes or lesions noted.  Labs:   Lab Results  Component Value Date   WBC 5.6 05/20/2015   HGB 14.3 05/20/2015   HCT 42.0 05/20/2015   MCV 87.8 05/20/2015   PLT 259 05/20/2015   No results for input(s): INR in the last 72 hours.  Recent Labs Lab 05/20/15 1348 05/20/15 1440  NA 141 143  K 4.0 3.8  CL 111 110  CO2 20*  --   BUN 16 17  CREATININE 0.97 0.90  CALCIUM 9.6  --   GLUCOSE 127* 116*   No results found for: MG  Recent Labs  05/20/15 1830 05/21/15 0011 05/21/15 0537  TROPONINI <0.03 <0.03 <0.03    Recent Labs  05/20/15 1359  TROPIPOC 0.00   No results found for: PROBNP No results found for: CHOL, HDL, LDLCALC, TRIG No results found for: DDIMER LIPASE  Date/Time Value Ref Range Status  03/19/2015 04:09 PM 15 11 - 51 U/L Final   TSH  Date/Time Value Ref Range Status  03/25/2015 05:25 AM 1.209 0.350 - 4.500 uIU/mL Final    ECG:  Vent. rate 113 BPM PR interval 166 ms QRS duration 88 ms QT/QTc 344/471 ms P-R-T axes 78 8 56  Radiology:  Dg Chest 2  View  05/20/2015  CLINICAL DATA:  Chest pain and dysphagia EXAM: CHEST  2 VIEW COMPARISON:  March 19, 2015 FINDINGS: There is no edema or consolidation. The heart size and pulmonary vascularity are normal. No adenopathy. No bone lesions. No pneumothorax. IMPRESSION: No edema or consolidation. Electronically Signed   By: Bretta Bang III M.D.   On: 05/20/2015 15:43    ASSESSMENT AND PLAN:     1. Chest pain - With typical and atypical features. Occurs with minimal exertion and resolves with rest. EKG without acute ST or T wave abnormality. Troponin negative. She will benefit from stress test. NPO after mid night. Stress test tomorrow.  - Currently his chest pain is due to sinus tachycardia.  2. Sinus tachycardia - History of skipping beat at teenager. Never evaluated. No family history of congenital heart disease. Telemetry shows sinus tachycardia with rate up to 140s. We will check TSH. Will start BB. Check echo.   3. HTN - BP relatively stable. Discontinued lisinopril for possible angioedema.   4. HL - No results found for requested labs within last 365 days.  - Will check   Signed: Bhagat,Bhavinkumar, PA 05/21/2015, 12:09 PM Pager [phone]  Co-Sign MD   The patient was seen, examined and discussed with Bhagat,Bhavinkumar PA-C and I agree with the above.   49 year old female with h/o recent SBO, admitted with exertional chest pain, troponin negative x 3, ECG shows sinus tachycardia, telemetry shows sinus tachycardia up to 150 BPM. The patient states that she has lost 12 lbs in the last two weeks. We will check TSH, free T4, check echocardiogram and schedule an exercise nuclear stress test for tomorrow. Start metoprolol 25 mg po BID.  Lars Masson

## 2015-05-22 ENCOUNTER — Observation Stay (HOSPITAL_COMMUNITY): Payer: BLUE CROSS/BLUE SHIELD

## 2015-05-22 ENCOUNTER — Observation Stay (HOSPITAL_BASED_OUTPATIENT_CLINIC_OR_DEPARTMENT_OTHER): Payer: BLUE CROSS/BLUE SHIELD

## 2015-05-22 DIAGNOSIS — I10 Essential (primary) hypertension: Secondary | ICD-10-CM | POA: Diagnosis not present

## 2015-05-22 DIAGNOSIS — R079 Chest pain, unspecified: Secondary | ICD-10-CM | POA: Diagnosis not present

## 2015-05-22 DIAGNOSIS — R0789 Other chest pain: Secondary | ICD-10-CM

## 2015-05-22 DIAGNOSIS — E119 Type 2 diabetes mellitus without complications: Secondary | ICD-10-CM | POA: Diagnosis not present

## 2015-05-22 DIAGNOSIS — R000 Tachycardia, unspecified: Secondary | ICD-10-CM | POA: Diagnosis not present

## 2015-05-22 LAB — GLUCOSE, CAPILLARY
GLUCOSE-CAPILLARY: 99 mg/dL (ref 65–99)
GLUCOSE-CAPILLARY: 94 mg/dL (ref 65–99)
Glucose-Capillary: 126 mg/dL — ABNORMAL HIGH (ref 65–99)

## 2015-05-22 LAB — NM MYOCAR MULTI W/SPECT W/WALL MOTION / EF
CHL CUP RESTING HR STRESS: 82 {beats}/min
CSEPED: 5 min

## 2015-05-22 LAB — ECHOCARDIOGRAM COMPLETE
HEIGHTINCHES: 70 in
Weight: 3494.4 oz

## 2015-05-22 MED ORDER — ASPIRIN EC 81 MG PO TBEC: 81.0000 mg | DELAYED_RELEASE_TABLET | Freq: Every day | ORAL | Status: AC

## 2015-05-22 MED ORDER — METOPROLOL TARTRATE 25 MG PO TABS: 25.0000 mg | ORAL_TABLET | Freq: Two times a day (BID) | ORAL | Status: DC

## 2015-05-22 MED ORDER — REGADENOSON 0.4 MG/5ML IV SOLN
INTRAVENOUS | Status: AC
  Filled 2015-05-22: qty 5

## 2015-05-22 MED ORDER — REGADENOSON 0.4 MG/5ML IV SOLN
0.4000 mg | Freq: Once | INTRAVENOUS | Status: AC
  Administered 2015-05-22: 0.4 mg via INTRAVENOUS
  Filled 2015-05-22: qty 5

## 2015-05-22 MED ORDER — HYDROCHLOROTHIAZIDE 25 MG PO TABS: 25.0000 mg | ORAL_TABLET | Freq: Every day | ORAL | Status: DC

## 2015-05-22 MED ORDER — TECHNETIUM TC 99M SESTAMIBI GENERIC - CARDIOLITE
10.0000 | Freq: Once | INTRAVENOUS | Status: AC | PRN
  Administered 2015-05-22: 10 via INTRAVENOUS

## 2015-05-22 MED ORDER — TECHNETIUM TC 99M SESTAMIBI GENERIC - CARDIOLITE
30.0000 | Freq: Once | INTRAVENOUS | Status: AC | PRN
  Administered 2015-05-22: 30 via INTRAVENOUS

## 2015-05-22 MED ORDER — LUBIPROSTONE 24 MCG PO CAPS: 24.0000 ug | ORAL_CAPSULE | Freq: Two times a day (BID) | ORAL | Status: AC

## 2015-05-22 NOTE — Progress Notes (Signed)
Pt d/'d home. D/c Rx and instructions given to and d/w pt. Pt verbalizes understanding

## 2015-05-22 NOTE — Progress Notes (Signed)
Family Medicine Teaching Service Daily Progress Note Intern Pager: [phone]  Patient name: Janice Santos Medical record number: [id number] Date of birth: 12/04/1965 Age: 50 y.o. Gender: female  Primary Care Provider: No primary care provider on file. Consultants: Cardiology Code Status: FULL   Pt Overview and Major Events to Date:  3/16: Patient admitted for ACS r/o  3/18: Stress test to be performed  Assessment and Plan: Janice Santos is a 50 y.o. female presenting with chest pain. PMH is significant for HTN, DM, MDD, reported HLD, h/o alcohol abuse, and IBS.   Chest pain: Most likely stable angina. HEART Score 4. EKG unchanged. istat troponin negative. Wells score 0 (no longer tachycardic). CXR clear, no cough, afebrile. Patient with a psychiatric history however this does not sound like anxiety induced. - trend troponins: Negative - Cardiology consults: stress test to be performed 3/18  - Will follow-up post procedural recommendations  - Anticipate likely discharge after exam.  - Patient may benefit from daily ASA given age and risk factors  - Will recommend ASCVD risk calculation and possible statin to PCP     Conjunctivitis: Improved/resolved; there was a concern for angioedema w/ facial swelling and throatissues. This may have also been secondary to irritation from rubbing her eyes. Appears to have improved since admission. - hold lisinopril  - continue polytrim eye drops as her sxs have improved - continue to monitor for symptoms.   HTN: at goal currently. - given concerns for angioedema holding lisinopril  - continue HCTZ  - continue to monitor   Migraine: Patient with h/o migraines. With migraine on morning of 3/17. Not improved with Tylenol.  -have continued Topamax  -holding NSAIDs given ACS r/o   Alcohol abuse: history of significant withdrawal. Denies alcohol use since 4/16. Scores of 0  - D/C CIWA orders 3/17  Major depression: stable  - continue Prozac,  Topamax, amitriptyline   Diabetes mellitus: on metformin. Last A1c 6.2 - start sensitive sliding scale insulin  - CBGs with meals, if continues to be low, consider discontinuing this.   IBS: stable  - continue home Linzess and Amitiza   FEN/GI: IV saline lock, heart healthy carb modified  Prophylaxis: SQ lovenox   Disposition: Home pending cardiology clearance; anticipated 05/22/15.  Subjective:  Resting comfortably in bed on my arrival. Patient roused easily and denied any discomfort at this time. Asking when stress test will be performed. Patient was pleased to hear that she may be headed home after stress test is performed.  Objective: Temp:  [97.5 F (36.4 C)-98 F (36.7 C)] 97.5 F (36.4 C) (03/18 0558) Pulse Rate:  [75-128] 75 (03/18 0558) Resp:  [18] 18 (03/18 0558) BP: (100-135)/(54-94) 100/54 mmHg (03/18 0558) SpO2:  [100 %] 100 % (03/18 0558) Weight:  [218 lb 6.4 oz (99.066 kg)] 218 lb 6.4 oz (99.066 kg) (03/18 0558) Physical Exam: General: lying in bed resting comfortably, non-toxic appearing  HEENT: Conjunctivae not injected. EOMI with no discomfort. No facial swelling.  Cardiovascular: RRR. No murmurs appreciated. No pitting edema.  Respiratory: CTAB. Normal WOB.  Abdomen: +BS, soft, NTND    Laboratory:  Recent Labs Lab 05/20/15 1348 05/20/15 1440  WBC 5.6  --   HGB 13.0 14.3  HCT 38.9 42.0  PLT 259  --     Recent Labs Lab 05/20/15 1348 05/20/15 1440  NA 141 143  K 4.0 3.8  CL 111 110  CO2 20*  --   BUN 16 17  CREATININE 0.97 0.90  CALCIUM  9.6  --   GLUCOSE 127* 116*     Imaging/Diagnostic Tests: EKG: sinus tachycardia, HR 113, t wave flattening in V1, no significant change from 1/17.  Dg Chest 2 View  05/20/2015 CLINICAL DATA: Chest pain and dysphagia EXAM: CHEST 2 VIEW COMPARISON: March 19, 2015 FINDINGS: There is no edema or consolidation. The heart size and pulmonary vascularity are normal. No adenopathy. No bone lesions. No  pneumothorax. IMPRESSION: No edema or consolidation. Electronically Signed By: Bretta Bang III M.D. On: 05/20/2015 15:43   Kathee Delton, MD 05/22/2015, 10:01 AM PGY-2, [facility] FPTS Intern pager: [phone], text pages welcome

## 2015-05-22 NOTE — Progress Notes (Signed)
    SUBJECTIVE:  No further chest pain.     PHYSICAL EXAM Filed Vitals:   05/22/15 1054 05/22/15 1056 05/22/15 1058 05/22/15 1215  BP: 99/74 121/81 130/77 140/84  Pulse: 94 90 94 76  Temp:    97.4 F (36.3 C)  TempSrc:    Oral  Resp:    18  Height:      Weight:      SpO2:    98%   General:  No acute distress Lungs:  Clear Heart:  RRR Abdomen:  Positive bowel sounds, no rebound no guarding Extremities:  No edeam   LABS: Lab Results  Component Value Date   TROPONINI <0.03 05/21/2015   Results for orders placed or performed during the hospital encounter of 05/20/15 (from the past 24 hour(s))  Glucose, capillary     Status: None   Collection Time: 05/21/15  4:48 PM  Result Value Ref Range   Glucose-Capillary 74 65 - 99 mg/dL  TSH     Status: None   Collection Time: 05/21/15  7:39 PM  Result Value Ref Range   TSH 1.202 0.350 - 4.500 uIU/mL  Glucose, capillary     Status: None   Collection Time: 05/21/15 10:09 PM  Result Value Ref Range   Glucose-Capillary 99 65 - 99 mg/dL   Comment 1 Notify RN    Comment 2 Document in Chart   Glucose, capillary     Status: None   Collection Time: 05/22/15  5:57 AM  Result Value Ref Range   Glucose-Capillary 94 65 - 99 mg/dL   Comment 1 Notify RN    Comment 2 Document in Chart     Intake/Output Summary (Last 24 hours) at 05/22/15 1409 Last data filed at 05/21/15 2356  Gross per 24 hour  Intake    240 ml  Output    375 ml  Net   -135 ml    ASSESSMENT AND PLAN:  CHEST PAIN:  Atypical.    EF 56% without ischemia.  No further cardiac work up.    TACHYCARDIA:    Heart rate down.  Echo pending.    TSH normal.    Continue low dose beta blocker.   Janice Santos 05/22/2015 2:09 PM

## 2015-05-22 NOTE — Progress Notes (Signed)
Primary cardiologist:  Dr. Tobias AlexanderKatarina Santos   Subjective:    No further chest pain.   Objective:   Temp:  [97.5 F (36.4 C)-98 F (36.7 C)] 97.5 F (36.4 C) (03/18 0558) Pulse Rate:  [75-128] 82 (03/18 1031) Resp:  [18] 18 (03/18 0558) BP: (100-135)/(54-94) 120/79 mmHg (03/18 1031) SpO2:  [100 %] 100 % (03/18 0558) Weight:  [218 lb 6.4 oz (99.066 kg)] 218 lb 6.4 oz (99.066 kg) (03/18 0558) Last BM Date: 05/18/15  Filed Weights   05/20/15 1751 05/21/15 0438 05/22/15 0558  Weight: 221 lb 9.6 oz (100.517 kg) 221 lb 3.2 oz (100.336 kg) 218 lb 6.4 oz (99.066 kg)    Intake/Output Summary (Last 24 hours) at 05/22/15 1051 Last data filed at 05/21/15 2356  Gross per 24 hour  Intake    480 ml  Output    875 ml  Net   -395 ml    Exam:  General:  NAD  HEENT:  No JVD  Lungs:  CTA bilat no rales  Cardiac:  RRR no murmur  Abdomen:  Soft, NT  Neuro:  No focal deficit  Psych:  Normal affect   Lab Results:  Basic Metabolic Panel:  Recent Labs Lab 05/20/15 1348 05/20/15 1440  NA 141 143  K 4.0 3.8  CL 111 110  CO2 20*  --   GLUCOSE 127* 116*  BUN 16 17  CREATININE 0.97 0.90  CALCIUM 9.6  --     Liver Function Tests: No results for input(s): AST, ALT, ALKPHOS, BILITOT, PROT, ALBUMIN in the last 168 hours.  CBC:  Recent Labs Lab 05/20/15 1348 05/20/15 1440  WBC 5.6  --   HGB 13.0 14.3  HCT 38.9 42.0  MCV 87.8  --   PLT 259  --     Cardiac Enzymes:  Recent Labs Lab 05/20/15 1830 05/21/15 0011 05/21/15 0537  TROPONINI <0.03 <0.03 <0.03    BNP: No results for input(s): PROBNP in the last 8760 hours.  Coagulation: No results for input(s): INR in the last 168 hours.  ECG:  NSR, no ST changes   Medications:   Scheduled Medications: . acyclovir  400 mg Oral BID  . amitriptyline  50 mg Oral QHS  . enoxaparin (LOVENOX) injection  40 mg Subcutaneous Q24H  . FLUoxetine  40 mg Oral Daily  . folic acid  1 mg Oral Daily  .  hydrochlorothiazide  25 mg Oral Daily  . insulin aspart  0-5 Units Subcutaneous QHS  . insulin aspart  0-9 Units Subcutaneous TID WC  . Linaclotide  145 mcg Oral Daily  . lubiprostone  24 mcg Oral BID  . metoprolol tartrate  25 mg Oral BID  . multivitamin with minerals  1 tablet Oral Daily  . regadenoson      . thiamine  100 mg Oral Daily  . topiramate  100 mg Oral BID  . trimethoprim-polymyxin b  1 drop Both Eyes 6 times per day    Infusions:    PRN Medications: acetaminophen, diphenhydrAMINE, ondansetron (ZOFRAN) IV   Assessment:    Principal Problem:   Chest pain Active Problems:   Bilateral conjunctivitis   Essential hypertension   Type 2 diabetes mellitus without complication, without long-term current use of insulin (HCC)   Sinus tachycardia (HCC)     Plan/Discussion:    1. Chest pain - Admx with exertional chest pain.  Multiple risk factors including DM, HTN, HL.  CEs neg.  CXR neg.  For Lexiscan-Myoview  today.  -  Lexiscan-Myoview: Tol well.  Images pending.  2. Sinus tachycardia - TSH normal. Echo pending.  3. HTN - BP controlled.   Tereso Newcomer, PA-C   05/22/2015 10:51 AM Pager [phone]   Error; see progress note from Dr Antoine Poche. Olga Millers

## 2015-05-22 NOTE — Discharge Instructions (Signed)
Janice Santos,  You were evaluated for chest pain. The electrical activity of your heart on EKG was normal, as were cardiac enzymes. Your stress test did not show any evidence that your heart was not getting enough oxygen. Cardiology recommends continuing the medication metoprolol, which helps control heart rate and blood pressure. We prescribed daily aspirin to continue going home. We stopped your lisinopril, in case it caused your facial swelling and throat tightness. Your primary care doctor may decide to start you on an alternative medication to lisinopril. She may also start a medication to help lower your cholesterol. The results of your echocardiogram are still pending.  Chest Pain Observation It is often hard to give a specific diagnosis for the cause of chest pain. Among other possibilities your symptoms might be caused by inadequate oxygen delivery to your heart (angina). Angina that is not treated or evaluated can lead to a heart attack (myocardial infarction) or death. Blood tests, electrocardiograms, and X-rays may have been done to help determine a possible cause of your chest pain. After evaluation and observation, your health care provider has determined that it is unlikely your pain was caused by an unstable condition that requires hospitalization. However, a full evaluation of your pain may need to be completed, with additional diagnostic testing as directed. It is very important to keep your follow-up appointments. Not keeping your follow-up appointments could result in permanent heart damage, disability, or death. If there is any problem keeping your follow-up appointments, you must call your health care provider. HOME CARE INSTRUCTIONS  Due to the slight chance that your pain could be angina, it is important to follow your health care provider's treatment plan and also maintain a healthy lifestyle:  Maintain or work toward achieving a healthy weight.  Stay physically active and  exercise regularly.  Decrease your salt intake.  Eat a balanced, healthy diet. Talk to a dietitian to learn about heart-healthy foods.  Increase your fiber intake by including whole grains, vegetables, fruits, and nuts in your diet.  Avoid situations that cause stress, anger, or depression.  Take medicines as advised by your health care provider. Report any side effects to your health care provider. Do not stop medicines or adjust the dosages on your own.  Quit smoking. Do not use nicotine patches or gum until you check with your health care provider.  Keep your blood pressure, blood sugar, and cholesterol levels within normal limits.  Limit alcohol intake to no more than 1 drink per day for women who are not pregnant and 2 drinks per day for men.  Do not abuse drugs. SEEK IMMEDIATE MEDICAL CARE IF: You have severe chest pain or pressure which may include symptoms such as:  You feel pain or pressure in your arms, neck, jaw, or back.  You have severe back or abdominal pain, feel sick to your stomach (nauseous), or throw up (vomit).  You are sweating profusely.  You are having a fast or irregular heartbeat.  You feel short of breath while at rest.  You notice increasing shortness of breath during rest, sleep, or with activity.  You have chest pain that does not get better after rest or after taking your usual medicine.  You wake from sleep with chest pain.  You are unable to sleep because you cannot breathe.  You develop a frequent cough or you are coughing up blood.  You feel dizzy, faint, or experience extreme fatigue.  You develop severe weakness, dizziness, fainting, or chills. Any of  these symptoms may represent a serious problem that is an emergency. Do not wait to see if the symptoms will go away. Call your local emergency services (911 in the [place] Do not drive yourself to the hospital. MAKE SURE YOU:  Understand these instructions.  Will watch your  condition.  Will get help right away if you are not doing well or get worse.   This information is not intended to replace advice given to you by your health care provider. Make sure you discuss any questions you have with your health care provider.   Document Released: 03/25/2010 Document Revised: 02/25/2013 Document Reviewed: 08/22/2012 Elsevier Interactive Patient Education [employer]2016 [employer]

## 2015-05-22 NOTE — Progress Notes (Signed)
  Echocardiogram 2D Echocardiogram has been performed.  Janalyn HarderWest, Ruari Duggan R 05/22/2015, 4:10 PM

## 2015-11-11 ENCOUNTER — Emergency Department (HOSPITAL_COMMUNITY): Payer: Worker's Compensation

## 2015-11-11 ENCOUNTER — Emergency Department (HOSPITAL_COMMUNITY)
Admission: EM | Admit: 2015-11-11 | Discharge: 2015-11-11 | Disposition: A | Discharge status: Home / Self Care | Payer: Worker's Compensation | Attending: Emergency Medicine | Admitting: Emergency Medicine

## 2015-11-11 ENCOUNTER — Encounter (HOSPITAL_COMMUNITY): Payer: Self-pay

## 2015-11-11 DIAGNOSIS — Y9389 Activity, other specified: Secondary | ICD-10-CM | POA: Insufficient documentation

## 2015-11-11 DIAGNOSIS — R0789 Other chest pain: Secondary | ICD-10-CM | POA: Diagnosis not present

## 2015-11-11 DIAGNOSIS — S199XXA Unspecified injury of neck, initial encounter: Secondary | ICD-10-CM | POA: Diagnosis present

## 2015-11-11 DIAGNOSIS — M62838 Other muscle spasm: Secondary | ICD-10-CM

## 2015-11-11 DIAGNOSIS — E119 Type 2 diabetes mellitus without complications: Secondary | ICD-10-CM | POA: Insufficient documentation

## 2015-11-11 DIAGNOSIS — Z79899 Other long term (current) drug therapy: Secondary | ICD-10-CM | POA: Diagnosis not present

## 2015-11-11 DIAGNOSIS — Y92410 Unspecified street and highway as the place of occurrence of the external cause: Secondary | ICD-10-CM | POA: Diagnosis not present

## 2015-11-11 DIAGNOSIS — S161XXA Strain of muscle, fascia and tendon at neck level, initial encounter: Secondary | ICD-10-CM | POA: Diagnosis not present

## 2015-11-11 DIAGNOSIS — Z7982 Long term (current) use of aspirin: Secondary | ICD-10-CM | POA: Insufficient documentation

## 2015-11-11 DIAGNOSIS — Y999 Unspecified external cause status: Secondary | ICD-10-CM | POA: Insufficient documentation

## 2015-11-11 DIAGNOSIS — Z7984 Long term (current) use of oral hypoglycemic drugs: Secondary | ICD-10-CM | POA: Diagnosis not present

## 2015-11-11 DIAGNOSIS — I10 Essential (primary) hypertension: Secondary | ICD-10-CM | POA: Diagnosis not present

## 2015-11-11 MED ORDER — CYCLOBENZAPRINE HCL 10 MG PO TABS: 10.0000 mg | ORAL_TABLET | Freq: Three times a day (TID) | ORAL | 0 refills | Status: DC | PRN

## 2015-11-11 MED ORDER — NAPROXEN 500 MG PO TABS: 500.0000 mg | ORAL_TABLET | Freq: Two times a day (BID) | ORAL | 0 refills | Status: DC | PRN

## 2015-11-11 MED ORDER — HYDROCODONE-ACETAMINOPHEN 5-325 MG PO TABS
1.0000 | ORAL_TABLET | Freq: Once | ORAL | Status: AC
  Administered 2015-11-11: 1 via ORAL
  Filled 2015-11-11: qty 1

## 2015-11-11 MED ORDER — HYDROCODONE-ACETAMINOPHEN 5-325 MG PO TABS: 1.0000 | ORAL_TABLET | Freq: Four times a day (QID) | ORAL | 0 refills | Status: DC | PRN
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
## 2015-11-11 NOTE — ED Notes (Signed)
Pt c/o chest pain during inspiration.  Will order CXR

## 2015-11-11 NOTE — ED Notes (Signed)
Bed: WA27 Expected date:  Expected time:  Means of arrival:  Comments: 

## 2015-11-11 NOTE — Discharge Instructions (Signed)
Take naprosyn as directed for inflammation and pain with norco for breakthrough pain and flexeril for muscle relaxation. Do not drive or operate machinery with pain medication or muscle relaxant use. Ice to areas of soreness for the next 24 hours and then may move to heat, no more than 20 minutes at a time every hour for each. Expect to be sore for the next few days and follow up with primary care physician for recheck of ongoing symptoms in the next 1-2 weeks. Return to ER for emergent changing or worsening of symptoms.
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
## 2016-02-29 ENCOUNTER — Emergency Department (HOSPITAL_COMMUNITY): Payer: PRIVATE HEALTH INSURANCE

## 2016-02-29 ENCOUNTER — Emergency Department (HOSPITAL_COMMUNITY)
Admission: EM | Admit: 2016-02-29 | Discharge: 2016-02-29 | Disposition: A | Discharge status: Home / Self Care | Payer: PRIVATE HEALTH INSURANCE | Attending: Emergency Medicine | Admitting: Emergency Medicine

## 2016-02-29 DIAGNOSIS — I110 Hypertensive heart disease with heart failure: Secondary | ICD-10-CM | POA: Diagnosis not present

## 2016-02-29 DIAGNOSIS — Z7982 Long term (current) use of aspirin: Secondary | ICD-10-CM | POA: Diagnosis not present

## 2016-02-29 DIAGNOSIS — Z7984 Long term (current) use of oral hypoglycemic drugs: Secondary | ICD-10-CM | POA: Diagnosis not present

## 2016-02-29 DIAGNOSIS — I5020 Unspecified systolic (congestive) heart failure: Secondary | ICD-10-CM | POA: Diagnosis not present

## 2016-02-29 DIAGNOSIS — R600 Localized edema: Secondary | ICD-10-CM | POA: Diagnosis not present

## 2016-02-29 DIAGNOSIS — Z8679 Personal history of other diseases of the circulatory system: Secondary | ICD-10-CM

## 2016-02-29 DIAGNOSIS — R0602 Shortness of breath: Secondary | ICD-10-CM | POA: Diagnosis not present

## 2016-02-29 DIAGNOSIS — R079 Chest pain, unspecified: Secondary | ICD-10-CM | POA: Insufficient documentation

## 2016-02-29 DIAGNOSIS — E119 Type 2 diabetes mellitus without complications: Secondary | ICD-10-CM | POA: Diagnosis not present

## 2016-02-29 LAB — COMPREHENSIVE METABOLIC PANEL
ALBUMIN: 3.7 g/dL (ref 3.5–5.0)
ALK PHOS: 110 U/L (ref 38–126)
ALT: 22 U/L (ref 14–54)
ANION GAP: 6 (ref 5–15)
AST: 26 U/L (ref 15–41)
BILIRUBIN TOTAL: 0.4 mg/dL (ref 0.3–1.2)
BUN: 15 mg/dL (ref 6–20)
CALCIUM: 9 mg/dL (ref 8.9–10.3)
CO2: 21 mmol/L — AB (ref 22–32)
Chloride: 115 mmol/L — ABNORMAL HIGH (ref 101–111)
Creatinine, Ser: 1.02 mg/dL — ABNORMAL HIGH (ref 0.44–1.00)
GFR calc Af Amer: >60 mL/min (ref >60)
GFR calc non Af Amer: >60 mL/min (ref >60)
GLUCOSE: 93 mg/dL (ref 65–99)
Potassium: 4 mmol/L (ref 3.5–5.1)
SODIUM: 142 mmol/L (ref 135–145)
TOTAL PROTEIN: 6.5 g/dL (ref 6.5–8.1)

## 2016-02-29 LAB — PROTIME-INR
INR: 1
Prothrombin Time: 13.2 seconds (ref 11.4–15.2)

## 2016-02-29 LAB — TROPONIN I

## 2016-02-29 LAB — BRAIN NATRIURETIC PEPTIDE: B Natriuretic Peptide: 69.8 pg/mL (ref 0.0–100.0)

## 2016-02-29 LAB — CBC
HEMATOCRIT: 37.3 % (ref 36.0–46.0)
HEMOGLOBIN: 12.6 g/dL (ref 12.0–15.0)
MCH: 29.4 pg (ref 26.0–34.0)
MCHC: 33.8 g/dL (ref 30.0–36.0)
MCV: 87.1 fL (ref 78.0–100.0)
Platelets: 276 10*3/uL (ref 150–400)
RBC: 4.28 MIL/uL (ref 3.87–5.11)
RDW: 14.1 % (ref 11.5–15.5)
WBC: 7.8 10*3/uL (ref 4.0–10.5)

## 2016-02-29 LAB — MAGNESIUM: Magnesium: 1.8 mg/dL (ref 1.7–2.4)

## 2016-02-29 MED ORDER — ASPIRIN 81 MG PO CHEW
324.0000 mg | CHEWABLE_TABLET | Freq: Once | ORAL | Status: AC
  Administered 2016-02-29: 324 mg via ORAL
  Filled 2016-02-29: qty 4

## 2016-02-29 NOTE — ED Notes (Signed)
Pt taken to xray 

## 2016-02-29 NOTE — ED Provider Notes (Signed)
[facility]-EMERGENCY DEPT Provider Note   CSN: [id number] Arrival date & time: 02/29/16  [id number]     History   Chief Complaint Chief Complaint  Patient presents with  . Chest Pain    HPI Janice Santos Janice Santos is a 50 y.o. female.  The history is provided by the patient and medical records. No language interpreter was used.     50 year old female with a past medical history of alcoholism, diabetes, hypertension, migraines, systolic congestive heart failure with last ejection fraction being 40-45% presents today with worsening dyspnea on exertion over the last 2 months and chest pain worsening today. Patient states that she is [facility] for her congestive heart failure. States that she has gained 20-40 pounds over the last 2 months. States that her abdomen feels much more distended. States that 2 weeks ago she had cough and congestion and saw her primary doctor about it and related to her increased weight gain with them and they increased her hydrochlorothiazide. States that today she became very dyspneic on exertion and also had chest tightness associated with this as well. This did improve with rest. Her symptoms overall seem to worsen with exertion. Patient states she is concerned because she has not followed up with her cardiologist for several months now. Denies any fevers, chills, vomiting, diarrhea, dysuria, hematuria. Does endorse nausea associated with this.   Past Medical History:  Diagnosis Date  . Alcoholism (HCC)   . Diabetes mellitus without complication (HCC)   . GERD (gastroesophageal reflux disease)   . Hypertension   . Migraines     Patient Active Problem List   Diagnosis Date Noted  . Sinus tachycardia 05/22/2015  . Chest pain 05/20/2015  . Bilateral conjunctivitis   . Pain in the chest   . Essential hypertension   . Type 2 diabetes mellitus without complication, without long-term current use of insulin (HCC)   . Alcoholism (HCC)   . Diabetes  mellitus without complication (HCC)   . GERD (gastroesophageal reflux disease)   . Hypertension   . Gastroesophageal reflux disease without esophagitis   . Major depressive disorder (HCC) 04/25/2013  . PTSD (post-traumatic stress disorder) 04/25/2013  . Alcohol dependence (HCC) 04/24/2013  . Alcohol withdrawal (HCC) 04/24/2013    Past Surgical History:  Procedure Laterality Date  . ABDOMINAL HYSTERECTOMY    . CHOLECYSTECTOMY    . ESOPHAGOGASTRODUODENOSCOPY (EGD) WITH PROPOFOL N/A 03/26/2015   Procedure: ESOPHAGOGASTRODUODENOSCOPY (EGD) WITH PROPOFOL;  Surgeon: Iva Booparl E Gessner, MD;  Location: [facility] ENDOSCOPY;  Service: Endoscopy;  Laterality: N/A;  . GASTRIC BYPASS      OB History    No data available       Home Medications    Prior to Admission medications   Medication Sig Start Date End Date Taking? Authorizing Provider  acyclovir (ZOVIRAX) 400 MG tablet Take 400 mg by mouth 2 (two) times daily.    Historical Provider, MD  amitriptyline (ELAVIL) 25 MG tablet Take 50 mg by mouth at bedtime.    Historical Provider, MD  aspirin EC 81 MG tablet Take 1 tablet (81 mg total) by mouth daily. 05/22/15   Hillary Percell BostonMoen Fitzgerald, MD  Cholecalciferol (VITAMIN D3) 5000 UNITS TABS Take 1 tablet by mouth daily.    Historical Provider, MD  Cyanocobalamin (B-12) 2000 MCG TABS Take 4,000 mcg by mouth daily.    Historical Provider, MD  cyclobenzaprine (FLEXERIL) 10 MG tablet Take 1 tablet (10 mg total) by mouth 3 (three) times daily as needed for  muscle spasms. 11/11/15   Mercedes Camprubi-Soms, PA-C  FLUoxetine (PROZAC) 40 MG capsule Take 40 mg by mouth daily. 02/23/15   Historical Provider, MD  hydrochlorothiazide (HYDRODIURIL) 25 MG tablet Take 1 tablet (25 mg total) by mouth daily. 05/22/15   Kathee Delton, MD  HYDROcodone-acetaminophen (NORCO) 5-325 MG tablet Take 1 tablet by mouth every 6 (six) hours as needed for severe pain. 11/11/15   Mercedes Camprubi-Soms, PA-C  LINZESS 145 MCG CAPS capsule Take  145 mg by mouth daily. 04/20/15   Historical Provider, MD  metFORMIN (GLUCOPHAGE) 500 MG tablet Take 1 tablet (500 mg total) by mouth 3 (three) times daily. 04/28/13   Thermon Leyland, NP  metoCLOPramide (REGLAN) 5 MG tablet Take 1 tablet (5 mg total) by mouth 4 (four) times daily. 03/26/15   Simbiso Ranga, MD  metoprolol tartrate (LOPRESSOR) 25 MG tablet Take 1 tablet (25 mg total) by mouth 2 (two) times daily. 05/22/15   Kathee Delton, MD  Multiple Vitamin (MULTIVITAMIN WITH MINERALS) TABS tablet Take 1 tablet by mouth daily.    Historical Provider, MD  naproxen (NAPROSYN) 500 MG tablet Take 1 tablet (500 mg total) by mouth 2 (two) times daily as needed for mild pain, moderate pain or headache (TAKE WITH MEALS.). 11/11/15   Mercedes Camprubi-Soms, PA-C  topiramate (TOPAMAX) 100 MG tablet TAKE 1 TABLET (100 MG TOTAL) BY MOUTH 2 TIMES DAILY. 03/03/15   Historical Provider, MD  traMADol (ULTRAM) 50 MG tablet Take 2 tablets (100 mg total) by mouth every 6 (six) hours as needed. Patient taking differently: Take 100 mg by mouth every 6 (six) hours as needed for moderate pain.  03/26/15   Conley Canal, MD    Family History Family History  Problem Relation Age of Onset  . Diabetes Mother   . Hypertension Mother   . Diabetes Father   . Hypertension Father   . Diabetes Brother   . Hypertension Brother   . Diabetes Sister     Social History Social History  Substance Use Topics  . Smoking status: Never Smoker  . Smokeless tobacco: Never Used  . Alcohol use No     Comment: recovering alcoholic- last drink 06/2014     Allergies   Morphine and related and Sulfa antibiotics   Review of Systems Review of Systems  Constitutional: Positive for fatigue (with exertion). Negative for chills and fever.  HENT: Negative for ear pain and sore throat.   Respiratory: Positive for shortness of breath (with exertion). Negative for cough.   Cardiovascular: Positive for leg swelling (worsening over the last two  months). Negative for chest pain and palpitations.  Gastrointestinal: Positive for abdominal distention (gradual worsening over last 2 months) and nausea. Negative for abdominal pain and vomiting.  Genitourinary: Negative for dysuria and hematuria.  Musculoskeletal: Negative for arthralgias and back pain.  Skin: Negative for color change and rash.  Neurological: Negative for seizures and syncope.  Psychiatric/Behavioral: Negative for agitation and confusion.  All other systems reviewed and are negative.    Physical Exam Updated Vital Signs BP 118/86   Pulse 87   Temp 98 F (36.7 C) (Oral)   Resp 16   Wt 117.9 kg   SpO2 97%   BMI 37.31 kg/m   Physical Exam  Constitutional: She is oriented to person, place, and time. No distress.  HENT:  Head: Normocephalic and atraumatic.  Eyes: Conjunctivae and EOM are normal.  Neck: Normal range of motion. Neck supple. No JVD present.  Cardiovascular:  Normal rate and regular rhythm.   Pulmonary/Chest: Effort normal and breath sounds normal. No respiratory distress. She has no wheezes. She has no rales.  Abdominal: Soft. There is no tenderness. There is no guarding.  Musculoskeletal: Normal range of motion. She exhibits edema (mild pitting edema to bilateral lower extremities).  Neurological: She is alert and oriented to person, place, and time.  Skin: Skin is warm and dry. She is not diaphoretic.  Nursing note and vitals reviewed.    ED Treatments / Results  Labs (all labs ordered are listed, but only abnormal results are displayed) Labs Reviewed  COMPREHENSIVE METABOLIC PANEL - Abnormal; Notable for the following:       Result Value   Chloride 115 (*)    CO2 21 (*)    Creatinine, Ser 1.02 (*)    All other components within normal limits  CBC  MAGNESIUM  PROTIME-INR  TROPONIN I  BRAIN NATRIURETIC PEPTIDE  TROPONIN I    EKG  EKG Interpretation  Date/Time:  Tuesday February 29 2016 19:13:36 EST Ventricular Rate:  91 PR  Interval:  164 QRS Duration: 90 QT Interval:  376 QTC Calculation: 462 R Axis:   64 Text Interpretation:  Normal sinus rhythm ST-t wave abnormality Artifact No significant change since last tracing Abnormal ekg Confirmed by Gerhard MunchLOCKWOOD, ROBERT  MD [phone]([id number]) on 02/29/2016 7:22:44 PM       Radiology Dg Chest 2 View  Result Date: 02/29/2016 CLINICAL DATA:  Chest pain EXAM: CHEST  2 VIEW COMPARISON:  11/11/2015 FINDINGS: Normal heart size and mediastinal contours. No acute infiltrate or edema. No effusion or pneumothorax. Cholecystectomy clips. No acute osseous findings. IMPRESSION: No active cardiopulmonary disease. Electronically Signed   By: Marnee SpringJonathon  Watts M.D.   On: 02/29/2016 20:41    Procedures Procedures (including critical care time)  Medications Ordered in ED Medications  aspirin chewable tablet 324 mg (324 mg Oral Given 02/29/16 2015)     Initial Impression / Assessment and Plan / ED Course  I have reviewed the triage vital signs and the nursing notes.  Pertinent labs & imaging results that were available during my care of the patient were reviewed by me and considered in my medical decision making (see chart for details).  Clinical Course     50 year old female with a past medical history of alcoholism, diabetes, hypertension, migraines, systolic congestive heart failure with last ejection fraction being 40-45% presents today with worsening dyspnea on exertion over the last 2 months and chest pain worsening today. Patient's EKG without any acute ischemic changes on my review. Vital signs are within normal limits. Reviewed chart and patient does have a [facility] that does not have any inducible ischemia. No overt signs of fluid overload on my exam. Her natruretic peptide is within normal limits. Findings do not seem consistent with acute CHF exacerbation. Patient has close follow-up with her cardiologist within the next 3 days. Obtained serial  troponins, both of which were negative. We will discharge the patient home with plan to follow up with cardiology. Return precautions given.  Final Clinical Impressions(s) / ED Diagnoses   Final diagnoses:  Chest pain, unspecified type  History of CHF (congestive heart failure)  Shortness of breath    New Prescriptions New Prescriptions   No medications on file     Madolyn FriezeVijay Fynn Adel, MD 02/29/16 [id number]    Gerhard Munchobert Lockwood, MD 03/01/16 [phone]

## 2016-02-29 NOTE — ED Triage Notes (Addendum)
Pt to ED via [facility] with c/o substernal chest pain onset this am. Pt also has productive cough that makes her chest pain worse.  Pt has also gained 40 lbs in one month with hx of CHF  EMS gave pt NTG 1 SL without relief of pain, also ASA 324mg  and Zofran 4mg  IV

## 2016-04-14 ENCOUNTER — Emergency Department (HOSPITAL_COMMUNITY): Payer: PRIVATE HEALTH INSURANCE

## 2016-04-14 ENCOUNTER — Encounter (HOSPITAL_COMMUNITY): Payer: Self-pay

## 2016-04-14 ENCOUNTER — Emergency Department (HOSPITAL_COMMUNITY)
Admission: EM | Admit: 2016-04-14 | Discharge: 2016-04-15 | Disposition: A | Discharge status: Home / Self Care | Payer: PRIVATE HEALTH INSURANCE | Attending: Emergency Medicine | Admitting: Emergency Medicine

## 2016-04-14 DIAGNOSIS — I10 Essential (primary) hypertension: Secondary | ICD-10-CM | POA: Diagnosis not present

## 2016-04-14 DIAGNOSIS — M7989 Other specified soft tissue disorders: Secondary | ICD-10-CM | POA: Insufficient documentation

## 2016-04-14 DIAGNOSIS — E119 Type 2 diabetes mellitus without complications: Secondary | ICD-10-CM | POA: Insufficient documentation

## 2016-04-14 DIAGNOSIS — Z794 Long term (current) use of insulin: Secondary | ICD-10-CM | POA: Diagnosis not present

## 2016-04-14 DIAGNOSIS — Z7982 Long term (current) use of aspirin: Secondary | ICD-10-CM | POA: Insufficient documentation

## 2016-04-14 DIAGNOSIS — Z79899 Other long term (current) drug therapy: Secondary | ICD-10-CM | POA: Diagnosis not present

## 2016-04-14 LAB — I-STAT TROPONIN, ED: Troponin i, poc: 0 ng/mL (ref 0.00–0.08)

## 2016-04-14 LAB — BASIC METABOLIC PANEL
ANION GAP: 8 (ref 5–15)
BUN: 14 mg/dL (ref 6–20)
CHLORIDE: 117 mmol/L — AB (ref 101–111)
CO2: 19 mmol/L — AB (ref 22–32)
Calcium: 9 mg/dL (ref 8.9–10.3)
Creatinine, Ser: 0.88 mg/dL (ref 0.44–1.00)
GFR calc Af Amer: >60 mL/min (ref >60)
GLUCOSE: 118 mg/dL — AB (ref 65–99)
POTASSIUM: 3.5 mmol/L (ref 3.5–5.1)
Sodium: 144 mmol/L (ref 135–145)

## 2016-04-14 LAB — PROTIME-INR
INR: 1.01
Prothrombin Time: 13.4 seconds (ref 11.4–15.2)

## 2016-04-14 LAB — CBC
HEMATOCRIT: 38 % (ref 36.0–46.0)
HEMOGLOBIN: 12.7 g/dL (ref 12.0–15.0)
MCH: 28.9 pg (ref 26.0–34.0)
MCHC: 33.4 g/dL (ref 30.0–36.0)
MCV: 86.6 fL (ref 78.0–100.0)
Platelets: 263 10*3/uL (ref 150–400)
RBC: 4.39 MIL/uL (ref 3.87–5.11)
RDW: 13.9 % (ref 11.5–15.5)
WBC: 7 10*3/uL (ref 4.0–10.5)

## 2016-04-14 NOTE — ED Provider Notes (Signed)
[facility]-EMERGENCY DEPT Provider Note   CSN: [id number] Arrival date & time: 04/14/16  1812  By signing my name below, I, Doreatha MartinEva Mathews, attest that this documentation has been prepared under the direction and in the presence of Latrish Mogel, MD. Electronically Signed: Doreatha MartinEva Mathews, ED Scribe. 04/14/16. 11:27 PM.     History   Chief Complaint Chief Complaint  Patient presents with  . B/L LE Edema    HPI Janice Santos is a 51 y.o. female with h/o DM, HTN on HCTZ, reported h/o CHF who presents to the Emergency Department complaining of moderate, gradually worsening bilateral leg swelling x 1 month with associated calf pain. Pt states her pain is worsened with walking and her swelling is worsened when sitting. She states she is not wearing compression hose. Pt states her dose of HCTZ was increased at her last cardiology appt last month, which initially alleviated her swelling before it worsened again. She reports that her PCP and cardiologist did not have appointments for her to be seen so she was referred to the ED. She denies additional symptoms or complaints.   The history is provided by the patient. No language interpreter was used.  Leg Pain   This is a recurrent problem. The current episode started more than 1 week ago. The problem occurs constantly. The problem has been gradually worsening. The pain is present in the right lower leg and left lower leg. The quality of the pain is described as dull. The pain is moderate. Pertinent negatives include no numbness. Associated symptoms comments: Swelling. The symptoms are aggravated by standing. She has tried nothing (HCTZ) for the symptoms. The treatment provided mild relief. There has been no history of extremity trauma. Family history is significant for no rheumatoid arthritis.    Past Medical History:  Diagnosis Date  . Alcoholism (HCC)   . Diabetes mellitus without complication (HCC)   . GERD (gastroesophageal reflux disease)   .  Hypertension   . Migraines     Patient Active Problem List   Diagnosis Date Noted  . Sinus tachycardia 05/22/2015  . Chest pain 05/20/2015  . Bilateral conjunctivitis   . Pain in the chest   . Essential hypertension   . Type 2 diabetes mellitus without complication, without long-term current use of insulin (HCC)   . Alcoholism (HCC)   . Diabetes mellitus without complication (HCC)   . GERD (gastroesophageal reflux disease)   . Hypertension   . Gastroesophageal reflux disease without esophagitis   . Major depressive disorder (HCC) 04/25/2013  . PTSD (post-traumatic stress disorder) 04/25/2013  . Alcohol dependence (HCC) 04/24/2013  . Alcohol withdrawal (HCC) 04/24/2013    Past Surgical History:  Procedure Laterality Date  . ABDOMINAL HYSTERECTOMY    . CHOLECYSTECTOMY    . ESOPHAGOGASTRODUODENOSCOPY (EGD) WITH PROPOFOL N/A 03/26/2015   Procedure: ESOPHAGOGASTRODUODENOSCOPY (EGD) WITH PROPOFOL;  Surgeon: Iva Booparl E Gessner, MD;  Location: [facility] ENDOSCOPY;  Service: Endoscopy;  Laterality: N/A;  . GASTRIC BYPASS      OB History    No data available       Home Medications    Prior to Admission medications   Medication Sig Start Date End Date Taking? Authorizing Provider  acyclovir (ZOVIRAX) 400 MG tablet Take 800 mg by mouth every morning.    Yes Historical Provider, MD  amitriptyline (ELAVIL) 25 MG tablet Take 50 mg by mouth at bedtime.    Yes Historical Provider, MD  aspirin EC 81 MG tablet Take 1 tablet (81 mg total) by  mouth daily. Patient taking differently: Take 81 mg by mouth every morning.  05/22/15  Yes [name], MD  carvedilol (COREG) 12.5 MG tablet Take 12.5 mg by mouth 2 (two) times daily. 02/08/16  Yes Historical Provider, MD  Cholecalciferol (VITAMIN D3) 5000 UNITS TABS Take 5,000 Units by mouth every morning.    Yes Historical Provider, MD  Cyanocobalamin (B-12) 2000 MCG TABS Take 4,000 mcg by mouth every morning.    Yes Historical Provider, MD  folic  acid (FOLVITE) 1 MG tablet Take 1 mg by mouth every morning. 12/24/15  Yes Historical Provider, MD  hydrochlorothiazide (HYDRODIURIL) 25 MG tablet Take 1 tablet (25 mg total) by mouth daily. Patient taking differently: Take 50 mg by mouth 2 (two) times daily.  05/22/15  Yes Kathee Delton, MD  metFORMIN (GLUCOPHAGE) 500 MG tablet Take 1 tablet (500 mg total) by mouth 3 (three) times daily. 04/28/13  Yes Thermon Leyland, NP  Multiple Vitamin (MULTIVITAMIN WITH MINERALS) TABS tablet Take 1 tablet by mouth every morning.    Yes Historical Provider, MD  topiramate (TOPAMAX) 100 MG tablet TAKE 200 MG BY MOUTH AT BEDTIME 03/03/15  Yes Historical Provider, MD  cyclobenzaprine (FLEXERIL) 10 MG tablet Take 1 tablet (10 mg total) by mouth 3 (three) times daily as needed for muscle spasms. Patient not taking: Reported on 04/14/2016 11/11/15   Mercedes Street, PA-C  HYDROcodone-acetaminophen [facility]) 5-325 MG tablet Take 1 tablet by mouth every 6 (six) hours as needed for severe pain. Patient not taking: Reported on 04/14/2016 11/11/15   Mercedes [address], PA-C  metoCLOPramide (REGLAN) 5 MG tablet Take 1 tablet (5 mg total) by mouth 4 (four) times daily. Patient not taking: Reported on 04/14/2016 03/26/15   Conley Canal, MD  metoprolol tartrate (LOPRESSOR) 25 MG tablet Take 1 tablet (25 mg total) by mouth 2 (two) times daily. Patient not taking: Reported on 04/14/2016 05/22/15   Kathee Delton, MD  naproxen (NAPROSYN) 500 MG tablet Take 1 tablet (500 mg total) by mouth 2 (two) times daily as needed for mild pain, moderate pain or headache (TAKE WITH MEALS.). Patient not taking: Reported on 04/14/2016 11/11/15   [facility] Street, PA-C  traMADol (ULTRAM) 50 MG tablet Take 2 tablets (100 mg total) by mouth every 6 (six) hours as needed. Patient not taking: Reported on 04/14/2016 03/26/15   Conley Canal, MD    Family History Family History  Problem Relation Age of Onset  . Diabetes Mother   . Hypertension Mother   . Diabetes Father   .  Hypertension Father   . Diabetes Brother   . Hypertension Brother   . Diabetes Sister     Social History Social History  Substance Use Topics  . Smoking status: Never Smoker  . Smokeless tobacco: Never Used  . Alcohol use No     Comment: recovering alcoholic- last drink 06/2014     Allergies   Lisinopril; Morphine and related; and Sulfa antibiotics   Review of Systems Review of Systems  Constitutional: Negative for fever.  Respiratory: Negative for shortness of breath.   Cardiovascular: Negative for chest pain and palpitations.  Musculoskeletal: Positive for myalgias (leg pain). Negative for arthralgias and gait problem.  Neurological: Negative for numbness.  All other systems reviewed and are negative.    Physical Exam Updated Vital Signs BP 156/100 (BP Location: Left Arm)   Pulse 109   Temp 97.9 F (36.6 C) (Oral)   Resp 18   Ht 5\' 10"  (1.778 m)  Wt 260 lb (117.9 kg)   SpO2 100%   BMI 37.31 kg/m   Physical Exam  Constitutional: She is oriented to person, place, and time. She appears well-developed and well-nourished.  HENT:  Head: Normocephalic and atraumatic.  Mouth/Throat: Oropharynx is clear and moist. No oropharyngeal exudate.  Moist mucous membranes. No exudates.   Eyes: Conjunctivae and EOM are normal. Pupils are equal, round, and reactive to light.  Neck: Normal range of motion. Neck supple. No JVD present. No tracheal deviation present.  No carotid bruits. Trachea midline.   Cardiovascular: Normal rate, regular rhythm, normal heart sounds and intact distal pulses.  Exam reveals no gallop and no friction rub.   No murmur heard. RRR.   Pulmonary/Chest: Effort normal and breath sounds normal. No stridor. No respiratory distress. She has no wheezes. She has no rales.  Lungs CTA bilaterally.   Abdominal: Soft. Bowel sounds are normal. She exhibits no distension and no mass. There is no tenderness. There is no rebound and no guarding.  Musculoskeletal:  Normal range of motion. She exhibits no edema.  No cords, No pitting edema. No non pitting edema  Lymphadenopathy:    She has no cervical adenopathy.  Neurological: She is alert and oriented to person, place, and time. She has normal reflexes. She displays normal reflexes.  Skin: Skin is warm and dry. Capillary refill takes less than 2 seconds.  Psychiatric: She has a normal mood and affect.  Nursing note and vitals reviewed.    ED Treatments / Results   Vitals:   04/14/16 2215 04/15/16 0204  BP: 156/100 157/97  Pulse: 109 77  Resp: 18 19  Temp:      DIAGNOSTIC STUDIES: Oxygen Saturation is 100% on RA, normal by my interpretation.    COORDINATION OF CARE: 11:21 PM Discussed treatment plan with pt at bedside which includes lab work, CXR, compression stockings, D-dimer and pt agreed to plan.    Results for orders placed or performed during the hospital encounter of 04/14/16  Basic metabolic panel  Result Value Ref Range   Sodium 144 135 - 145 mmol/L   Potassium 3.5 3.5 - 5.1 mmol/L   Chloride 117 (H) 101 - 111 mmol/L   CO2 19 (L) 22 - 32 mmol/L   Glucose, Bld 118 (H) 65 - 99 mg/dL   BUN 14 6 - 20 mg/dL   Creatinine, Ser 1.61 0.44 - 1.00 mg/dL   Calcium 9.0 8.9 - 09.6 mg/dL   GFR calc non Af Amer >60 >60 mL/min   GFR calc Af Amer >60 >60 mL/min   Anion gap 8 5 - 15  CBC  Result Value Ref Range   WBC 7.0 4.0 - 10.5 K/uL   RBC 4.39 3.87 - 5.11 MIL/uL   Hemoglobin 12.7 12.0 - 15.0 g/dL   HCT 04.5 40.9 - 81.1 %   MCV 86.6 78.0 - 100.0 fL   MCH 28.9 26.0 - 34.0 pg   MCHC 33.4 30.0 - 36.0 g/dL   RDW 91.4 78.2 - 95.6 %   Platelets 263 150 - 400 K/uL  Protime-INR (order if Patient is taking Coumadin / Warfarin)  Result Value Ref Range   Prothrombin Time 13.4 11.4 - 15.2 seconds   INR 1.01   D-dimer, quantitative (not at [facility])  Result Value Ref Range   D-Dimer, Quant 1.20 (H) 0.00 - 0.50 ug/mL-FEU  I-stat troponin, ED  Result Value Ref Range   Troponin i, poc 0.00  0.00 - 0.08 ng/mL  Comment 3          I-stat troponin, ED  Result Value Ref Range   Troponin i, poc 0.00 0.00 - 0.08 ng/mL   Comment 3           Dg Chest 2 View  Result Date: 04/14/2016 CLINICAL DATA:  Chest pain beginning yesterday, worse today. Shortness of breath. Bilateral leg swelling. EXAM: CHEST  2 VIEW COMPARISON:  02/29/2016 FINDINGS: The cardiomediastinal silhouette is within normal limits. The lungs are well inflated and clear. There is no evidence of pleural effusion or pneumothorax. No acute osseous abnormality is identified. IMPRESSION: No active cardiopulmonary disease. Electronically Signed   By: Sebastian Ache M.D.   On: 04/14/2016 18:55   Ct Angio Chest Pe W And/or Wo Contrast  Result Date: 04/15/2016 CLINICAL DATA:  Mid sternal chest pain and shortness of breath swelling in the lower legs positive D-dimer EXAM: CT ANGIOGRAPHY CHEST WITH CONTRAST TECHNIQUE: Multidetector CT imaging of the chest was performed using the standard protocol during bolus administration of intravenous contrast. Multiplanar CT image reconstructions and MIPs were obtained to evaluate the vascular anatomy. CONTRAST:  100 mL Isovue 370 intravenous COMPARISON:  Chest x-ray 04/14/2016, CT 03/23/2015 FINDINGS: Cardiovascular: Satisfactory opacification of the pulmonary arteries to the segmental level. No evidence of pulmonary embolism. Normal heart size. No pericardial effusion. Non aneurysmal aorta. Mediastinum/Nodes: No significantly enlarged mediastinal lymph nodes. Thyroid normal. Trachea midline. Small distal esophageal hiatal hernia. Lungs/Pleura: No acute infiltrate or effusion. No pneumothorax. 4 mm perifissural lymph node in the right upper lobe. Upper Abdomen: Postsurgical changes of the proximal stomach. Surgical clips in the right upper quadrant Musculoskeletal: No chest wall abnormality. No acute or significant osseous findings. Review of the MIP images confirms the above findings. IMPRESSION: Negative  for acute pulmonary embolus. 4 mm right upper lobe pulmonary nodule. No follow-up needed if patient is low-risk. Non-contrast chest CT can be considered in 12 months if patient is high-risk. This recommendation follows the consensus statement: Guidelines for Management of Incidental Pulmonary Nodules Detected on CT Images: From the Fleischner Society 2017; Radiology 2017; 284:228-243. Electronically Signed   By: Jasmine Pang M.D.   On: 04/15/2016 02:50    Radiology Dg Chest 2 View  Result Date: 04/14/2016 CLINICAL DATA:  Chest pain beginning yesterday, worse today. Shortness of breath. Bilateral leg swelling. EXAM: CHEST  2 VIEW COMPARISON:  02/29/2016 FINDINGS: The cardiomediastinal silhouette is within normal limits. The lungs are well inflated and clear. There is no evidence of pleural effusion or pneumothorax. No acute osseous abnormality is identified. IMPRESSION: No active cardiopulmonary disease. Electronically Signed   By: Sebastian Ache M.D.   On: 04/14/2016 18:55    Procedures Procedures (including critical care time)   EKG Interpretation  Date/Time:  Friday April 14 2016 18:26:57 EST Ventricular Rate:  115 PR Interval:    QRS Duration: 97 QT Interval:  356 QTC Calculation: 493 R Axis:   -41 Text Interpretation:  Sinus tachycardia Left axis deviation Confirmed by [facility]  MD, Iliyah Bui ([id number]) on 04/15/2016 3:04:33 AM      Medications  iopamidol (ISOVUE-370) 76 % injection (not administered)  iopamidol (ISOVUE-370) 76 % injection 100 mL (100 mLs Intravenous Contrast Given 04/15/16 0220)  Lovenox per pharmacy consult  Ruled out for PE in the ED.  Will set up for outpatient B DVT study. Lovenox administered prior to discharge.  Follow up with your PMD.  Ruled out for MI in the ED. No signs of EDEMA.  Will need outpatient repeat chest CT in 6 months to assess stability of nodule seen on CT.  This was both communicated to you verbally and also placed on your discharge papers  please take these papers to your doctor to have your family doctor schedule this test.  All questions answered to patient's satisfaction. Based on history and exam patient has been appropriately medically screened and emergency conditions excluded. Patient is stable for discharge at this time. Strict return precautions given for any further episodes, persistent fever, weakness or any concerns. New Prescriptions  New Prescriptions New Prescriptions   No medications on file    I personally performed the services described in this documentation, which was scribed in my presence. The recorded information has been reviewed and is accurate.      Cy Blamer, MD 04/15/16 [phone]

## 2016-04-14 NOTE — ED Triage Notes (Signed)
PT C/O MID-STERNAL CHEST PAIN AND SOB SINCE YESTERDAY. PT STS THE SWELLING HAS INCREASED IN HER LOWER LEGS IN THE PAST FEW WEEKS.

## 2016-04-15 ENCOUNTER — Ambulatory Visit (HOSPITAL_COMMUNITY): Admission: RE | Admit: 2016-04-15 | Payer: BLUE CROSS/BLUE SHIELD | Source: Ambulatory Visit

## 2016-04-15 ENCOUNTER — Emergency Department (HOSPITAL_COMMUNITY): Payer: PRIVATE HEALTH INSURANCE

## 2016-04-15 ENCOUNTER — Encounter (HOSPITAL_COMMUNITY): Payer: Self-pay | Admitting: Radiology

## 2016-04-15 LAB — D-DIMER, QUANTITATIVE: D-Dimer, Quant: 1.2 ug/mL-FEU — ABNORMAL HIGH (ref 0.00–0.50)

## 2016-04-15 LAB — I-STAT TROPONIN, ED: TROPONIN I, POC: 0 ng/mL (ref 0.00–0.08)

## 2016-04-15 MED ORDER — IOPAMIDOL (ISOVUE-370) INJECTION 76%
INTRAVENOUS | Status: AC
  Filled 2016-04-15: qty 100

## 2016-04-15 MED ORDER — ENOXAPARIN SODIUM 120 MG/0.8ML ~~LOC~~ SOLN
1.0000 mg/kg | Freq: Once | SUBCUTANEOUS | Status: AC
  Administered 2016-04-15: 120 mg via SUBCUTANEOUS
  Filled 2016-04-15: qty 0.8

## 2016-04-15 MED ORDER — IOPAMIDOL (ISOVUE-370) INJECTION 76%
100.0000 mL | Freq: Once | INTRAVENOUS | Status: AC | PRN
  Administered 2016-04-15: 100 mL via INTRAVENOUS

## 2016-04-15 NOTE — ED Notes (Signed)
Pt transported to CT ?

## 2016-04-15 NOTE — Discharge Instructions (Signed)
You will need an outpatient chest CT to assess stability of pulmonary nodule seen on CT scan please ask your doctor to schedule this test

## 2016-04-15 NOTE — ED Notes (Signed)
Unsuccessful draw. Phlebotomist to attempt

## 2016-04-17 ENCOUNTER — Ambulatory Visit (HOSPITAL_COMMUNITY)
Admission: RE | Admit: 2016-04-17 | Discharge: 2016-04-17 | Disposition: A | Discharge status: Home / Self Care | Payer: BLUE CROSS/BLUE SHIELD | Source: Ambulatory Visit | Attending: Emergency Medicine | Admitting: Emergency Medicine

## 2016-04-17 DIAGNOSIS — M7989 Other specified soft tissue disorders: Secondary | ICD-10-CM

## 2016-04-17 DIAGNOSIS — R791 Abnormal coagulation profile: Secondary | ICD-10-CM | POA: Insufficient documentation

## 2016-04-17 NOTE — Progress Notes (Signed)
*  PRELIMINARY RESULTS* Vascular Ultrasound Bilateral lower extremity venous duplex has been completed.  Preliminary findings: No evidence of deep vein thrombosis or baker's cysts bilaterally.   Janice FischerCharlotte C [name] 04/17/2016, 5:33 PM

## 2016-05-24 ENCOUNTER — Emergency Department (HOSPITAL_COMMUNITY): Payer: PRIVATE HEALTH INSURANCE

## 2016-05-24 ENCOUNTER — Encounter (HOSPITAL_COMMUNITY): Payer: Self-pay

## 2016-05-24 ENCOUNTER — Emergency Department (HOSPITAL_COMMUNITY)
Admission: EM | Admit: 2016-05-24 | Discharge: 2016-05-25 | Disposition: A | Discharge status: Home / Self Care | Payer: PRIVATE HEALTH INSURANCE | Attending: Emergency Medicine | Admitting: Emergency Medicine

## 2016-05-24 DIAGNOSIS — R42 Dizziness and giddiness: Secondary | ICD-10-CM | POA: Diagnosis not present

## 2016-05-24 DIAGNOSIS — E1165 Type 2 diabetes mellitus with hyperglycemia: Secondary | ICD-10-CM | POA: Insufficient documentation

## 2016-05-24 DIAGNOSIS — I10 Essential (primary) hypertension: Secondary | ICD-10-CM | POA: Insufficient documentation

## 2016-05-24 DIAGNOSIS — R739 Hyperglycemia, unspecified: Secondary | ICD-10-CM

## 2016-05-24 DIAGNOSIS — Z79899 Other long term (current) drug therapy: Secondary | ICD-10-CM | POA: Diagnosis not present

## 2016-05-24 DIAGNOSIS — Z7984 Long term (current) use of oral hypoglycemic drugs: Secondary | ICD-10-CM | POA: Diagnosis not present

## 2016-05-24 DIAGNOSIS — Z7982 Long term (current) use of aspirin: Secondary | ICD-10-CM | POA: Insufficient documentation

## 2016-05-24 LAB — URINALYSIS, ROUTINE W REFLEX MICROSCOPIC
Bacteria, UA: NONE SEEN
Bilirubin Urine: NEGATIVE
HGB URINE DIPSTICK: NEGATIVE
KETONES UR: NEGATIVE mg/dL
LEUKOCYTES UA: NEGATIVE
Nitrite: NEGATIVE
PH: 5 (ref 5.0–8.0)
PROTEIN: NEGATIVE mg/dL
RBC / HPF: NONE SEEN RBC/hpf (ref 0–5)
Specific Gravity, Urine: 1.005 (ref 1.005–1.030)

## 2016-05-24 LAB — BASIC METABOLIC PANEL
Anion gap: 12 (ref 5–15)
BUN: 14 mg/dL (ref 6–20)
CHLORIDE: 110 mmol/L (ref 101–111)
CO2: 19 mmol/L — ABNORMAL LOW (ref 22–32)
Calcium: 9.4 mg/dL (ref 8.9–10.3)
Creatinine, Ser: 0.9 mg/dL (ref 0.44–1.00)
GFR calc Af Amer: >60 mL/min (ref >60)
GFR calc non Af Amer: >60 mL/min (ref >60)
Glucose, Bld: 223 mg/dL — ABNORMAL HIGH (ref 65–99)
POTASSIUM: 3.8 mmol/L (ref 3.5–5.1)
SODIUM: 141 mmol/L (ref 135–145)

## 2016-05-24 LAB — CBC
HCT: 42.4 % (ref 36.0–46.0)
HEMOGLOBIN: 13.9 g/dL (ref 12.0–15.0)
MCH: 28.9 pg (ref 26.0–34.0)
MCHC: 32.8 g/dL (ref 30.0–36.0)
MCV: 88.1 fL (ref 78.0–100.0)
PLATELETS: 256 10*3/uL (ref 150–400)
RBC: 4.81 MIL/uL (ref 3.87–5.11)
RDW: 13.5 % (ref 11.5–15.5)
WBC: 7.2 10*3/uL (ref 4.0–10.5)

## 2016-05-24 LAB — I-STAT VENOUS BLOOD GAS, ED
Acid-base deficit: 1 mmol/L (ref 0.0–2.0)
Bicarbonate: 21.6 mmol/L (ref 20.0–28.0)
O2 Saturation: 96 %
TCO2: 22 mmol/L (ref 0–100)
pCO2, Ven: 30.7 mmHg — ABNORMAL LOW (ref 44.0–60.0)
pH, Ven: 7.455 — ABNORMAL HIGH (ref 7.250–7.430)
pO2, Ven: 77 mmHg — ABNORMAL HIGH (ref 32.0–45.0)

## 2016-05-24 LAB — CBG MONITORING, ED
Glucose-Capillary: 137 mg/dL — ABNORMAL HIGH (ref 65–99)
Glucose-Capillary: 83 mg/dL (ref 65–99)

## 2016-05-24 MED ORDER — SODIUM CHLORIDE 0.9 % IV BOLUS (SEPSIS)
1000.0000 mL | Freq: Once | INTRAVENOUS | Status: AC
  Administered 2016-05-24: 1000 mL via INTRAVENOUS

## 2016-05-24 MED ORDER — KETOROLAC TROMETHAMINE 30 MG/ML IJ SOLN
30.0000 mg | Freq: Once | INTRAMUSCULAR | Status: AC
  Administered 2016-05-24: 30 mg via INTRAVENOUS
  Filled 2016-05-24: qty 1

## 2016-05-24 NOTE — ED Triage Notes (Addendum)
Pt endorses Blood sugar "going up for the last 4 days between 200-400" with nausea, denies vomiting. Pt also endorses blurred vision that began yesterday and headache. VSS. CBG 137 in triage.

## 2016-05-24 NOTE — ED Notes (Signed)
Pt to CT via stretcher

## 2016-05-24 NOTE — Discharge Instructions (Signed)
You will need to see your primary care doctor as soon as possible.  Return here as needed.

## 2016-05-24 NOTE — ED Provider Notes (Signed)
[facility]-EMERGENCY DEPT Provider Note   CSN: [id number] Arrival date & time: 05/24/16  1732     History   Chief Complaint Chief Complaint  Patient presents with  . Hyperglycemia    HPI Janice Santos is a 51 y.o. female.  HPI Patient presents to the emergency department with fluctuating blood sugars.  The patient, states she has also had some dizziness with headache.  The patient states that all of her symptoms have been intermittent.  She states that she did not take any medications prior to arrival.  She states that she is on metformin.  Patient states that she called her doctor about this and was advised to increase her metformin dose.  Patient states that she did not set up an appointment with her doctor.  Patient states something significant condition better or worseThe patient denies chest pain, shortness of breath,  neck pain, fever, cough, weakness, numbness, anorexia, edema, abdominal pain, nausea, vomiting, diarrhea, rash, back pain, dysuria, hematemesis, bloody stool, near syncope, or syncope. Past Medical History:  Diagnosis Date  . Alcoholism (HCC)   . Diabetes mellitus without complication (HCC)   . GERD (gastroesophageal reflux disease)   . Hypertension   . Migraines     Patient Active Problem List   Diagnosis Date Noted  . Sinus tachycardia 05/22/2015  . Chest pain 05/20/2015  . Bilateral conjunctivitis   . Pain in the chest   . Essential hypertension   . Type 2 diabetes mellitus without complication, without long-term current use of insulin (HCC)   . Alcoholism (HCC)   . Diabetes mellitus without complication (HCC)   . GERD (gastroesophageal reflux disease)   . Hypertension   . Gastroesophageal reflux disease without esophagitis   . Major depressive disorder (HCC) 04/25/2013  . PTSD (post-traumatic stress disorder) 04/25/2013  . Alcohol dependence (HCC) 04/24/2013  . Alcohol withdrawal (HCC) 04/24/2013    Past Surgical History:  Procedure Laterality Date    . ABDOMINAL HYSTERECTOMY    . CHOLECYSTECTOMY    . ESOPHAGOGASTRODUODENOSCOPY (EGD) WITH PROPOFOL N/A 03/26/2015   Procedure: ESOPHAGOGASTRODUODENOSCOPY (EGD) WITH PROPOFOL;  Surgeon: Iva Booparl E Gessner, MD;  Location: [facility];  Service: Endoscopy;  Laterality: N/A;  . GASTRIC BYPASS      OB History    No data available       Home Medications    Prior to Admission medications   Medication Sig Start Date End Date Taking? Authorizing Provider  acyclovir (ZOVIRAX) 400 MG tablet Take 800 mg by mouth every morning.    Yes Historical Provider, MD  amitriptyline (ELAVIL) 25 MG tablet Take 50 mg by mouth at bedtime.    Yes Historical Provider, MD  aspirin EC 81 MG tablet Take 1 tablet (81 mg total) by mouth daily. Patient taking differently: Take 81 mg by mouth every morning.  05/22/15  Yes Hillary Percell BostonMoen Fitzgerald, MD  carvedilol (COREG) 12.5 MG tablet Take 12.5 mg by mouth 2 (two) times daily. 02/08/16  Yes Historical Provider, MD  Cholecalciferol (VITAMIN D3) 5000 UNITS TABS Take 5,000 Units by mouth every morning.    Yes Historical Provider, MD  Cyanocobalamin (B-12) 2000 MCG TABS Take 4,000 mcg by mouth every morning.    Yes Historical Provider, MD  diclofenac sodium (VOLTAREN) 1 % GEL Apply 2 g topically 4 (four) times daily.   Yes Historical Provider, MD  folic acid (FOLVITE) 1 MG tablet Take 1 mg by mouth every morning. 12/24/15  Yes Historical Provider, MD  hydrochlorothiazide (HYDRODIURIL) 25 MG tablet  Take 1 tablet (25 mg total) by mouth daily. Patient taking differently: Take 50 mg by mouth 2 (two) times daily.  05/22/15  Yes Kathee Delton, MD  metFORMIN (GLUCOPHAGE) 500 MG tablet Take 1 tablet (500 mg total) by mouth 3 (three) times daily. Patient taking differently: Take 1,000 mg by mouth daily with breakfast.  04/28/13  Yes Thermon Leyland, NP  Multiple Vitamin (MULTIVITAMIN WITH MINERALS) TABS tablet Take 1 tablet by mouth every morning.    Yes Historical Provider, MD  topiramate  (TOPAMAX) 100 MG tablet TAKE 200 MG BY MOUTH AT BEDTIME 03/03/15  Yes Historical Provider, MD  ciprofloxacin (CIPRO) 500 MG tablet Take 500 mg by mouth 2 (two) times daily. 05/12/16   Historical Provider, MD    Family History Family History  Problem Relation Age of Onset  . Diabetes Mother   . Hypertension Mother   . Diabetes Father   . Hypertension Father   . Diabetes Brother   . Hypertension Brother   . Diabetes Sister     Social History Social History  Substance Use Topics  . Smoking status: Never Smoker  . Smokeless tobacco: Never Used  . Alcohol use No     Comment: recovering alcoholic- last drink 06/2014     Allergies   Lisinopril; Morphine and related; and Sulfa antibiotics   Review of Systems Review of Systems All other systems negative except as documented in the HPI. All pertinent positives and negatives as reviewed in the HPI.  Physical Exam Updated Vital Signs BP 132/77   Pulse 92   Temp 98.7 F (37.1 C) (Oral)   Resp 16   Ht 5\' 7"  (1.702 m)   Wt 113.4 kg   SpO2 99%   BMI 39.16 kg/m   Physical Exam  Constitutional: She is oriented to person, place, and time. She appears well-developed and well-nourished. No distress.  HENT:  Head: Normocephalic and atraumatic.  Mouth/Throat: Oropharynx is clear and moist.  Eyes: Pupils are equal, round, and reactive to light.  Neck: Normal range of motion. Neck supple.  Cardiovascular: Normal rate, regular rhythm and normal heart sounds.  Exam reveals no gallop and no friction rub.   No murmur heard. Pulmonary/Chest: Effort normal and breath sounds normal. No respiratory distress. She has no wheezes.  Abdominal: Soft. Bowel sounds are normal. She exhibits no distension. There is no tenderness.  Neurological: She is alert and oriented to person, place, and time. She exhibits normal muscle tone. Coordination normal.  Skin: Skin is warm and dry. Capillary refill takes less than 2 seconds. No rash noted. No erythema.    Psychiatric: She has a normal mood and affect. Her behavior is normal.  Nursing note and vitals reviewed.    ED Treatments / Results  Labs (all labs ordered are listed, but only abnormal results are displayed) Labs Reviewed  BASIC METABOLIC PANEL - Abnormal; Notable for the following:       Result Value   CO2 19 (*)    Glucose, Bld 223 (*)    All other components within normal limits  URINALYSIS, ROUTINE W REFLEX MICROSCOPIC - Abnormal; Notable for the following:    Color, Urine STRAW (*)    Glucose, UA >=500 (*)    Squamous Epithelial / LPF 0-5 (*)    All other components within normal limits  CBG MONITORING, ED - Abnormal; Notable for the following:    Glucose-Capillary 137 (*)    All other components within normal limits  I-STAT VENOUS  BLOOD GAS, ED - Abnormal; Notable for the following:    pH, Ven 7.455 (*)    pCO2, Ven 30.7 (*)    pO2, Ven 77.0 (*)    All other components within normal limits  CBC  CBG MONITORING, ED  CBG MONITORING, ED    EKG  EKG Interpretation None       Radiology Ct Head Wo Contrast  Result Date: 05/24/2016 CLINICAL DATA:  51 year old female with dizziness and headache. EXAM: CT HEAD WITHOUT CONTRAST TECHNIQUE: Contiguous axial images were obtained from the base of the skull through the vertex without intravenous contrast. COMPARISON:  Brain MRI dated 12/09/2014 FINDINGS: Brain: No evidence of acute infarction, hemorrhage, hydrocephalus, extra-axial collection or mass lesion/mass effect. Vascular: No hyperdense vessel or unexpected calcification. Skull: Normal. Negative for fracture or focal lesion. Sinuses/Orbits: No acute finding. Other: None. IMPRESSION: No acute intracranial pathology. Electronically Signed   By: Elgie Collard M.D.   On: 05/24/2016 23:03    Procedures Procedures (including critical care time)  Medications Ordered in ED Medications  ketorolac (TORADOL) 30 MG/ML injection 30 mg (not administered)  sodium chloride 0.9  % bolus 1,000 mL (0 mLs Intravenous Stopped 05/24/16 2314)     Initial Impression / Assessment and Plan / ED Course  I have reviewed the triage vital signs and the nursing notes.  Pertinent labs & imaging results that were available during my care of the patient were reviewed by me and considered in my medical decision making (see chart for details).     Issue will need to follow-up with her primary care Dr. for further evaluation if her blood sugars.  The patient is advised to return here for any worsening in her condition.  I feel that the patient's fluctuating blood sugars may be causing most of her symptoms.  Patient is advised plan and all questions were answered  Final Clinical Impressions(s) / ED Diagnoses   Final diagnoses:  None    New Prescriptions New Prescriptions   No medications on file     Charlestine Night, PA-C 05/24/16 2347    Loren Racer, MD 05/25/16 [phone]

## 2017-05-03 ENCOUNTER — Emergency Department (HOSPITAL_COMMUNITY)
Admission: EM | Admit: 2017-05-03 | Discharge: 2017-05-04 | Disposition: A | Discharge status: Home / Self Care | Payer: PRIVATE HEALTH INSURANCE | Attending: Emergency Medicine | Admitting: Emergency Medicine

## 2017-05-03 ENCOUNTER — Other Ambulatory Visit: Payer: Self-pay

## 2017-05-03 ENCOUNTER — Encounter (HOSPITAL_COMMUNITY): Payer: Self-pay | Admitting: Emergency Medicine

## 2017-05-03 ENCOUNTER — Emergency Department (HOSPITAL_COMMUNITY): Payer: PRIVATE HEALTH INSURANCE

## 2017-05-03 DIAGNOSIS — R110 Nausea: Secondary | ICD-10-CM

## 2017-05-03 DIAGNOSIS — R1012 Left upper quadrant pain: Secondary | ICD-10-CM

## 2017-05-03 DIAGNOSIS — K2900 Acute gastritis without bleeding: Secondary | ICD-10-CM

## 2017-05-03 DIAGNOSIS — Z79899 Other long term (current) drug therapy: Secondary | ICD-10-CM | POA: Insufficient documentation

## 2017-05-03 DIAGNOSIS — K5909 Other constipation: Secondary | ICD-10-CM | POA: Diagnosis not present

## 2017-05-03 DIAGNOSIS — E119 Type 2 diabetes mellitus without complications: Secondary | ICD-10-CM | POA: Diagnosis not present

## 2017-05-03 DIAGNOSIS — I10 Essential (primary) hypertension: Secondary | ICD-10-CM | POA: Diagnosis not present

## 2017-05-03 DIAGNOSIS — Z7982 Long term (current) use of aspirin: Secondary | ICD-10-CM | POA: Diagnosis not present

## 2017-05-03 LAB — COMPREHENSIVE METABOLIC PANEL
ALK PHOS: 146 U/L — AB (ref 38–126)
ALT: 38 U/L (ref 14–54)
AST: 55 U/L — ABNORMAL HIGH (ref 15–41)
Albumin: 4.1 g/dL (ref 3.5–5.0)
Anion gap: 10 (ref 5–15)
BUN: 17 mg/dL (ref 6–20)
CALCIUM: 9 mg/dL (ref 8.9–10.3)
CO2: 23 mmol/L (ref 22–32)
CREATININE: 1.07 mg/dL — AB (ref 0.44–1.00)
Chloride: 111 mmol/L (ref 101–111)
GFR, EST NON AFRICAN AMERICAN: 59 mL/min — AB (ref >60)
Glucose, Bld: 96 mg/dL (ref 65–99)
Potassium: 3.8 mmol/L (ref 3.5–5.1)
Sodium: 144 mmol/L (ref 135–145)
Total Bilirubin: 0.4 mg/dL (ref 0.3–1.2)
Total Protein: 6.7 g/dL (ref 6.5–8.1)

## 2017-05-03 LAB — LIPASE, BLOOD: Lipase: 25 U/L (ref 11–51)

## 2017-05-03 LAB — URINALYSIS, ROUTINE W REFLEX MICROSCOPIC
BILIRUBIN URINE: NEGATIVE
Bacteria, UA: NONE SEEN
Glucose, UA: NEGATIVE mg/dL
HGB URINE DIPSTICK: NEGATIVE
Ketones, ur: NEGATIVE mg/dL
Nitrite: NEGATIVE
PH: 5 (ref 5.0–8.0)
Protein, ur: NEGATIVE mg/dL
SPECIFIC GRAVITY, URINE: 1.024 (ref 1.005–1.030)
Squamous Epithelial / LPF: NONE SEEN

## 2017-05-03 LAB — CBC
HCT: 38.7 % (ref 36.0–46.0)
Hemoglobin: 12.8 g/dL (ref 12.0–15.0)
MCH: 29.6 pg (ref 26.0–34.0)
MCHC: 33.1 g/dL (ref 30.0–36.0)
MCV: 89.6 fL (ref 78.0–100.0)
PLATELETS: 259 10*3/uL (ref 150–400)
RBC: 4.32 MIL/uL (ref 3.87–5.11)
RDW: 13.7 % (ref 11.5–15.5)
WBC: 6.6 10*3/uL (ref 4.0–10.5)

## 2017-05-03 MED ORDER — IOPAMIDOL (ISOVUE-300) INJECTION 61%
INTRAVENOUS | Status: AC
  Administered 2017-05-03: 100 mL
  Filled 2017-05-03: qty 100

## 2017-05-03 NOTE — ED Notes (Signed)
Patient transported to CT 

## 2017-05-03 NOTE — ED Notes (Signed)
Pt given urine cup to obtain sample.

## 2017-05-03 NOTE — ED Triage Notes (Signed)
Pt c/o LUQ pain that radiates to the lower abdomen, worse after eating and drinking. Denies vomiting/diarrhea. C/o urinary frequency.

## 2017-05-03 NOTE — ED Notes (Addendum)
Patient states that when she eats or drinks, her lower left side hurts. Pain x 2 weeks.  Nausea x 2 days.  Patient states she has lost 30-40 lbs in a little over a month.

## 2017-05-04 MED ORDER — POLYETHYLENE GLYCOL 3350 17 G PO PACK: 17.0000 g | PACK | Freq: Every day | ORAL | 0 refills | Status: DC

## 2017-05-04 MED ORDER — TRAMADOL HCL 50 MG PO TABS: 50.0000 mg | ORAL_TABLET | Freq: Four times a day (QID) | ORAL | 0 refills | Status: DC | PRN

## 2017-05-04 MED ORDER — ESOMEPRAZOLE MAGNESIUM 40 MG PO CPDR: 40.0000 mg | DELAYED_RELEASE_CAPSULE | Freq: Every day | ORAL | 0 refills | Status: DC

## 2017-05-04 MED ORDER — SUCRALFATE 1 G PO TABS: 1.0000 g | ORAL_TABLET | Freq: Three times a day (TID) | ORAL | 0 refills | Status: DC

## 2017-05-04 MED ORDER — FENTANYL CITRATE (PF) 100 MCG/2ML IJ SOLN
100.0000 ug | Freq: Once | INTRAMUSCULAR | Status: AC
  Administered 2017-05-04: 100 ug via INTRAVENOUS
  Filled 2017-05-04: qty 2

## 2017-05-04 MED ORDER — DOCUSATE SODIUM 100 MG PO CAPS: 100.0000 mg | ORAL_CAPSULE | Freq: Two times a day (BID) | ORAL | 0 refills | Status: DC

## 2017-05-04 NOTE — Discharge Instructions (Signed)
Return here as needed.  Follow-up with the GI doctor provided.  The CT scan did not show any significant abnormalities other than constipation.

## 2017-05-04 NOTE — ED Notes (Signed)
Patient Alert and oriented to baseline. Stable and ambulatory to baseline. Patient verbalized understanding of the discharge instructions.  Patient belongings were taken by the patient.   

## 2017-05-04 NOTE — ED Provider Notes (Signed)
[facility] Provider Note   CSN: [id number] Arrival date & time: 05/03/17  1617     History   Chief Complaint Chief Complaint  Patient presents with  . Abdominal Pain    HPI Janice Selleronya Suchan is a 52 y.o. female.  HPI Patient presents to the emergency department with left upper abdominal pain that started about 1 month ago.  Is gotten worse over that timeframe.  She states that eating and drinking make the pain worse.  Patient states that she has had nausea but no vomiting patient states that she did not take any medications prior to arrival for symptoms.  The patient denies chest pain, shortness of breath, headache,blurred vision, neck pain, fever, cough, weakness, numbness, dizziness, anorexia, edema,  vomiting, diarrhea, rash, back pain, dysuria, hematemesis, bloody stool, near syncope, or syncope. Past Medical History:  Diagnosis Date  . Alcoholism (HCC)   . Diabetes mellitus without complication (HCC)   . GERD (gastroesophageal reflux disease)   . Hypertension   . Migraines     Patient Active Problem List   Diagnosis Date Noted  . Sinus tachycardia 05/22/2015  . Chest pain 05/20/2015  . Bilateral conjunctivitis   . Pain in the chest   . Essential hypertension   . Type 2 diabetes mellitus without complication, without long-term current use of insulin (HCC)   . Alcoholism (HCC)   . Diabetes mellitus without complication (HCC)   . GERD (gastroesophageal reflux disease)   . Hypertension   . Gastroesophageal reflux disease without esophagitis   . Major depressive disorder (HCC) 04/25/2013  . PTSD (post-traumatic stress disorder) 04/25/2013  . Alcohol dependence (HCC) 04/24/2013  . Alcohol withdrawal (HCC) 04/24/2013    Past Surgical History:  Procedure Laterality Date  . ABDOMINAL HYSTERECTOMY    . CHOLECYSTECTOMY    . ESOPHAGOGASTRODUODENOSCOPY (EGD) WITH PROPOFOL N/A 03/26/2015   Procedure: ESOPHAGOGASTRODUODENOSCOPY (EGD) WITH  PROPOFOL;  Surgeon: Iva Booparl E Gessner, MD;  Location: [facility] END[facility];  Service: Endoscopy;  Laterality: N/A;  . GASTRIC BYPASS      OB History    No data available       Home Medications    Prior to Admission medications   Medication Sig Start Date End Date Taking? Authorizing Provider  acyclovir (ZOVIRAX) 400 MG tablet Take 800 mg by mouth every morning.     [provider]  amitriptyline (ELAVIL) 25 MG tablet Take 50 mg by mouth at bedtime.     [provider]  aspirin EC 81 MG tablet Take 1 tablet (81 mg total) by mouth daily. Patient taking differently: Take 81 mg by mouth every morning.  05/22/15   Casey BurkittFitzgerald, Hillary Moen, MD  carvedilol (COREG) 12.5 MG tablet Take 12.5 mg by mouth 2 (two) times daily. 02/08/16   [provider]  Cholecalciferol (VITAMIN D3) 5000 UNITS TABS Take 5,000 Units by mouth every morning.     [provider]  ciprofloxacin (CIPRO) 500 MG tablet Take 500 mg by mouth 2 (two) times daily. 05/12/16   [provider]  Cyanocobalamin (B-12) 2000 MCG TABS Take 4,000 mcg by mouth every morning.     [provider]  diclofenac sodium (VOLTAREN) 1 % GEL Apply 2 g topically 4 (four) times daily.    [provider]  folic acid (FOLVITE) 1 MG tablet Take 1 mg by mouth every morning. 12/24/15   [provider]  hydrochlorothiazide (HYDRODIURIL) 25 MG tablet Take 1 tablet (25 mg total) by mouth daily.  Patient taking differently: Take 50 mg by mouth 2 (two) times daily.  05/22/15   McKeag, Janine Ores, MD  metFORMIN (GLUCOPHAGE) 500 MG tablet Take 1 tablet (500 mg total) by mouth 3 (three) times daily. Patient taking differently: Take 1,000 mg by mouth daily with breakfast.  04/28/13   Thermon Leyland, NP  Multiple Vitamin (MULTIVITAMIN WITH MINERALS) TABS tablet Take 1 tablet by mouth every morning.     [provider]  topiramate (TOPAMAX) 100 MG tablet TAKE 200 MG BY MOUTH AT BEDTIME 03/03/15   [provider]    Family History Family History  Problem Relation Age of Onset  . Diabetes Mother   . Hypertension Mother   . Diabetes Father   . Hypertension Father   . Diabetes Brother   . Hypertension Brother   . Diabetes Sister     Social History Social History   Tobacco Use  . Smoking status: Never Smoker  . Smokeless tobacco: Never Used  Substance Use Topics  . Alcohol use: No    Comment: recovering alcoholic- last drink 06/2014  . Drug use: No     Allergies   Lisinopril; Morphine and related; and Sulfa antibiotics   Review of Systems Review of Systems All other systems negative except as documented in the HPI. All pertinent positives and negatives as reviewed in the HPI.  Physical Exam Updated Vital Signs BP (!) 144/81   Pulse 71   Temp 97.9 F (36.6 C) (Oral)   Resp 18   Ht 5\' 10"  (1.778 m)   SpO2 100%   BMI 35.87 kg/m   Physical Exam  Constitutional: She is oriented to person, place, and time. She appears well-developed and well-nourished. No distress.  HENT:  Head: Normocephalic and atraumatic.  Mouth/Throat: Oropharynx is clear and moist.  Eyes: Pupils are equal, round, and reactive to light.  Neck: Normal range of motion. Neck supple.  Cardiovascular: Normal rate, regular rhythm and normal heart sounds. Exam reveals no gallop and no friction rub.  No murmur heard. Pulmonary/Chest: Effort normal and breath sounds normal. No respiratory distress. She has no wheezes.  Abdominal: Soft. Bowel sounds are normal. She exhibits no distension. There is tenderness in the left upper quadrant and left lower quadrant. There is no rigidity, no rebound and no guarding.  Neurological: She is alert and oriented to person, place, and time. She exhibits normal muscle tone. Coordination normal.  Skin: Skin is warm and dry. Capillary refill takes less than 2 seconds. No rash noted. No erythema.  Psychiatric: She has a normal mood and affect. Her behavior is normal.    Nursing note and vitals reviewed.    ED Treatments / Results  Labs (all labs ordered are listed, but only abnormal results are displayed) Labs Reviewed  COMPREHENSIVE METABOLIC PANEL - Abnormal; Notable for the following components:      Result Value   Creatinine, Ser 1.07 (*)    AST 55 (*)    Alkaline Phosphatase 146 (*)    GFR calc non Af Amer 59 (*)    All other components within normal limits  URINALYSIS, ROUTINE W REFLEX MICROSCOPIC - Abnormal; Notable for the following components:   Leukocytes, UA TRACE (*)    All other components within normal limits  LIPASE, BLOOD  CBC  I-STAT BETA HCG BLOOD, ED ([facility], [facility], [facility] ONLY)    EKG  EKG Interpretation None       Radiology Ct Abdomen Pelvis W Contrast  Result Date:  05/03/2017 CLINICAL DATA:  52 year old female with left upper quadrant abdominal pain radiating to the lower abdomen. EXAM: CT ABDOMEN AND PELVIS WITH CONTRAST TECHNIQUE: Multidetector CT imaging of the abdomen and pelvis was performed using the standard protocol following bolus administration of intravenous contrast. CONTRAST:  ISOVUE-300 IOPAMIDOL (ISOVUE-300) INJECTION 61% COMPARISON:  Abdominal radiograph dated 03/25/2015 and CT dated 03/23/2015 FINDINGS: Lower chest: The visualized lung bases are clear. No intra-abdominal free air or free fluid. Hepatobiliary: The liver is unremarkable.  Cholecystectomy. Pancreas: Unremarkable. No pancreatic ductal dilatation or surrounding inflammatory changes. Spleen: Normal in size without focal abnormality. Adrenals/Urinary Tract: Adrenal glands are unremarkable. Kidneys are normal, without renal calculi, focal lesion, or hydronephrosis. Bladder is unremarkable. Stomach/Bowel: There is postsurgical changes of gastric bypass. There is no bowel obstruction or active inflammation. Large amount of stool noted throughout the colon. The appendix is not visualized. Vascular/Lymphatic: No significant vascular findings are present. No  enlarged abdominal or pelvic lymph nodes. Reproductive: Hysterectomy. Other: Postsurgical changes of the anterior abdominal wall. No fluid collection. Musculoskeletal: No acute or significant osseous findings. IMPRESSION: No definite acute intra-abdominal or pelvic pathology. Postsurgical changes of gastric bypass. No bowel obstruction or active inflammation. Constipation. Electronically Signed   By: Elgie Collard M.D.   On: 05/03/2017 23:58    Procedures Procedures (including critical care time)  Medications Ordered in ED Medications  iopamidol (ISOVUE-300) 61 % injection (100 mLs  Contrast Given 05/03/17 2315)     Initial Impression / Assessment and Plan / ED Course  I have reviewed the triage vital signs and the nursing notes.  Pertinent labs & imaging results that were available during my care of the patient were reviewed by me and considered in my medical decision making (see chart for details).     Patient CT scan was reviewed and there is no definite acute findings.  There is no obstruction or inflammation noted.  We will treat for the constipation as this could be contributing factor to her discomfort.  Also give her treatment for gastritis type scenario.  Due to the fact she states that eating drinking makes the condition worse.  Also have her follow-up with GI.  Told to return here as needed patient agrees with plan and all questions were answered  Final Clinical Impressions(s) / ED Diagnoses   Final diagnoses:  None    ED Discharge Orders    None       Charlestine Night, PA-C 05/04/17 0050    Tilden Fossa, MD 05/04/17 1455

## 2017-05-14 ENCOUNTER — Ambulatory Visit: Payer: PRIVATE HEALTH INSURANCE | Admitting: Gastroenterology

## 2017-07-12 ENCOUNTER — Other Ambulatory Visit: Payer: Self-pay | Admitting: Orthopedic Surgery

## 2017-07-31 NOTE — Pre-Procedure Instructions (Signed)
Janice Santos  07/31/2017      [id number] [place], [place] - [address] [place] [address] Phone: [phone] Fax: [phone]    Your procedure is scheduled on Thurs., August 09, 2017 from 12:00PM-2:00PM  Report to [facility] Admitting Entrance "A" at 10:00AM  Call this number if you have problems the morning of surgery:  [phone]   Remember:  No food or drinks after midnight on June 5th  Take these medicines the morning of surgery with A SIP OF WATER: Acyclovir (ZOVIRAX) and Metoprolol succinate (TOPROL-XL)   Follow your doctors instructions regarding your Aspirin.  If no instructions were given by your doctor, then you will need to call the prescribing office office to get instructions.    7 days before surgery (5/30), stop taking all Other Aspirin Products, Vitamins, Fish oils, and Herbal medications. Also stop all NSAIDS i.e. Advil, Ibuprofen, Motrin, Aleve, Anaprox, Naproxen, BC and Goody Powders.  How to Manage Your Diabetes Before and After Surgery  Why is it important to control my blood sugar before and after surgery? . Improving blood sugar levels before and after surgery helps healing and can limit problems. . A way of improving blood sugar control is eating a healthy diet by: o  Eating less sugar and carbohydrates o  Increasing activity/exercise o  Talking with your doctor about reaching your blood sugar goals . High blood sugars (greater than 180 mg/dL) can raise your risk of infections and slow your recovery, so you will need to focus on controlling your diabetes during the weeks before surgery. . Make sure that the doctor who takes care of your diabetes knows about your planned surgery including the date and location.  How do I manage my blood sugar before surgery? . Check your blood sugar at least 4 times a day, starting 2 days before surgery, to make sure that the level is not too high or  low. o Check your blood sugar the morning of your surgery when you wake up and every 2 hours until you get to the Short Stay unit. . If your blood sugar is less than 70 mg/dL, you will need to treat for low blood sugar: o Do not take insulin. o Treat a low blood sugar (less than 70 mg/dL) with  cup of clear juice (cranberry or apple), 4 glucose tablets, OR glucose gel. Recheck blood sugar in 15 minutes after treatment (to make sure it is greater than 70 mg/dL). If your blood sugar is not greater than 70 mg/dL on recheck, call [phone] o  for further instructions. . Report your blood sugar to the short stay nurse when you get to Short Stay.  . If you are admitted to the hospital after surgery: o Your blood sugar will be checked by the staff and you will probably be given insulin after surgery (instead of oral diabetes medicines) to make sure you have good blood sugar levels. o The goal for blood sugar control after surgery is 80-180 mg/dL.  WHAT DO I DO ABOUT MY DIABETES MEDICATION?  Marland Kitchen Do not take MetFORMIN (GLUCOPHAGE) the morning of surgery.  . If your CBG is greater than 220 mg/dL, call us at [phone]  Reviewed and Endorsed by [facility] Patient Education Committee, August 2015    Do not wear jewelry, make-up or nail polish (fingers).  Do not wear lotions, powders, or perfumes, or deodorant.  Do not shave 48 hours prior to  surgery.   Do not bring valuables to the hospital.  [facility] is not responsible for any belongings or valuables.  Contacts, dentures or bridgework may not be worn into surgery.  Leave your suitcase in the car.  After surgery it may be brought to your room.  For patients admitted to the hospital, discharge time will be determined by your treatment team.  Patients discharged the day of surgery will not be allowed to drive home.   Special instructions:   [facility] [facility]- Preparing For Surgery  Before surgery, you can play an important role. Because skin  is not sterile, your skin needs to be as free of germs as possible. You can reduce the number of germs on your skin by washing with CHG (chlorahexidine gluconate) Soap before surgery.  CHG is an antiseptic cleaner which kills germs and bonds with the skin to continue killing germs even after washing.    Oral Hygiene is also important to reduce your risk of infection.  Remember - BRUSH YOUR TEETH THE MORNING OF SURGERY WITH YOUR REGULAR TOOTHPASTE  Please do not use if you have an allergy to CHG or antibacterial soaps. If your skin becomes reddened/irritated stop using the CHG.  Do not shave (including legs and underarms) for at least 48 hours prior to first CHG shower. It is OK to shave your face.  Please follow these instructions carefully.   1. Shower the NIGHT BEFORE SURGERY and the MORNING OF SURGERY with CHG.   2. If you chose to wash your hair, wash your hair first as usual with your normal shampoo.  3. After you shampoo, rinse your hair and body thoroughly to remove the shampoo.  4. Use CHG as you would any other liquid soap. You can apply CHG directly to the skin and wash gently with a scrungie or a clean washcloth.   5. Apply the CHG Soap to your body ONLY FROM THE NECK DOWN.  Do not use on open wounds or open sores. Avoid contact with your eyes, ears, mouth and genitals (private parts). Wash Face and genitals (private parts)  with your normal soap.  6. Wash thoroughly, paying special attention to the area where your surgery will be performed.  7. Thoroughly rinse your body with warm water from the neck down.  8. DO NOT shower/wash with your normal soap after using and rinsing off the CHG Soap.  9. Pat yourself dry with a CLEAN TOWEL.  10. Wear CLEAN PAJAMAS to bed the night before surgery, wear comfortable clothes the morning of surgery  11. Place CLEAN SHEETS on your bed the night of your first shower and DO NOT SLEEP WITH PETS.  Day of Surgery:  Do not apply any  deodorants/lotions.  Please wear clean clothes to the hospital/surgery center.   Remember to brush your teeth WITH YOUR REGULAR TOOTHPASTE.  Please read over the following fact sheets that you were given. Pain Booklet, Coughing and Deep Breathing and Surgical Site Infection Prevention

## 2017-08-01 ENCOUNTER — Other Ambulatory Visit: Payer: Self-pay

## 2017-08-01 ENCOUNTER — Encounter (HOSPITAL_COMMUNITY): Payer: Self-pay

## 2017-08-01 ENCOUNTER — Encounter (HOSPITAL_COMMUNITY)
Admission: RE | Admit: 2017-08-01 | Discharge: 2017-08-01 | Disposition: A | Discharge status: Home / Self Care | Payer: PRIVATE HEALTH INSURANCE | Source: Ambulatory Visit | Attending: Orthopedic Surgery | Admitting: Orthopedic Surgery

## 2017-08-01 DIAGNOSIS — Z01812 Encounter for preprocedural laboratory examination: Secondary | ICD-10-CM | POA: Insufficient documentation

## 2017-08-01 HISTORY — DX: Other cyst of bone, right shoulder: M85.611

## 2017-08-01 HISTORY — DX: Unspecified rotator cuff tear or rupture of right shoulder, not specified as traumatic: M75.101

## 2017-08-01 HISTORY — DX: Primary osteoarthritis, unspecified shoulder: M19.019

## 2017-08-01 LAB — COMPREHENSIVE METABOLIC PANEL
ALK PHOS: 120 U/L (ref 38–126)
ALT: 39 U/L (ref 14–54)
AST: 54 U/L — ABNORMAL HIGH (ref 15–41)
Albumin: 4.3 g/dL (ref 3.5–5.0)
Anion gap: 10 (ref 5–15)
BILIRUBIN TOTAL: 0.6 mg/dL (ref 0.3–1.2)
BUN: 10 mg/dL (ref 6–20)
CALCIUM: 9.6 mg/dL (ref 8.9–10.3)
CO2: 26 mmol/L (ref 22–32)
CREATININE: 0.84 mg/dL (ref 0.44–1.00)
Chloride: 107 mmol/L (ref 101–111)
GFR calc non Af Amer: >60 mL/min (ref >60)
GLUCOSE: 95 mg/dL (ref 65–99)
Potassium: 3.9 mmol/L (ref 3.5–5.1)
Sodium: 143 mmol/L (ref 135–145)
TOTAL PROTEIN: 7.3 g/dL (ref 6.5–8.1)

## 2017-08-01 LAB — CBC
HCT: 41.2 % (ref 36.0–46.0)
HEMOGLOBIN: 13.3 g/dL (ref 12.0–15.0)
MCH: 29 pg (ref 26.0–34.0)
MCHC: 32.3 g/dL (ref 30.0–36.0)
MCV: 90 fL (ref 78.0–100.0)
PLATELETS: 275 10*3/uL (ref 150–400)
RBC: 4.58 MIL/uL (ref 3.87–5.11)
RDW: 13.2 % (ref 11.5–15.5)
WBC: 5.5 10*3/uL (ref 4.0–10.5)

## 2017-08-01 LAB — GLUCOSE, CAPILLARY: Glucose-Capillary: 107 mg/dL — ABNORMAL HIGH (ref 65–99)

## 2017-08-01 NOTE — Progress Notes (Signed)
PCP - PA Arliss Journey  Cardiologist - Dr. Therisa Doyne- [facility]  Chest x-ray - Denies  EKG - 09/2016- Requested- DOS if not received  Stress Test - 05/22/15 (E)  ECHO - 05/22/15 (E)  Cardiac Cath - Denies  Sleep Study - Denies CPAP -None   LABS- 08/01/17: CBC, CMP  ASA- LD- 5/30  HA1C- 07/23/17: 5.9 Fasting Blood Sugar - 70-100, Today 107 Checks Blood Sugar __1___ time a day  Anesthesia- Yes- Requested EKG  Pt denies having chest pain, sob, or fever at this time. All instructions explained to the pt, with a verbal understanding of the material. Pt agrees to go over the instructions while at home for a better understanding. The opportunity to ask questions was provided.

## 2017-08-02 NOTE — Progress Notes (Signed)
Anesthesia Chart Review:   Case:  [id number] Date/Time:  08/09/17 1145   Procedure:  RIGHT SHOULDER ARTHROSCOPY WITH ROTATOR CUFF REPAIR, SUBACROMIAL DECOMPRESSION, DISTAL CLAVICAL EXCISION, DEBRIDEMENT OF LABRAL CYST (Right Shoulder)   Anesthesia type:  Choice   Pre-op diagnosis:  RIGHT SHOULDER ROTATOR CUFF TEAR AC JOINT OSTEOARTHRITIS, LABRAL CYST   Location:  [facility] OR ROOM 07 / [facility] OR   Surgeon:  Jones Broom, MD      DISCUSSION: - Pt is a 52 year old female with hx CHF (likely due to alcohol abuse; EF 45-50% by 2017 echo), HTN, alcohol abuse (reportedly no alcohol since 2016)  - Pt has medical clearance for surgery   VS: BP 133/74   Pulse 81   Temp 36.8 C   Resp 20   Ht  (1.753 m)   Wt 214 lb 3.2 oz (97.2 kg)   SpO2 98%   BMI 31.63 kg/m   PROVIDERS:  Primary care at [facility]. Pt cleared for surgery at last office visit 07/20/17 by Arliss Journey, PA Cardiologist is Therisa Doyne, MD.  Last office visit 01/30/17; 1 year f/u recommended (notes in care everywhere)    LABS: Labs reviewed: Acceptable for surgery. (all labs ordered are listed, but only abnormal results are displayed)  Labs Reviewed  GLUCOSE, CAPILLARY - Abnormal; Notable for the following components:      Result Value   Glucose-Capillary 107 (*)    All other components within normal limits  COMPREHENSIVE METABOLIC PANEL - Abnormal; Notable for the following components:   AST 54 (*)    All other components within normal limits  CBC    EKG: Will be obtained day of surgery    CV:  Echo 03/03/16 (care everywhere):  - LV size is normal. Normal LV wall thickness. LV systolic function is mild to moderately reduced (ejection  fraction = 45-50%). - RV normal in size and function. - Both atria normal in size. - No significant valvular stenosis or regurgitation. - There is no pericardial effusion. - The inferior vena cava was not visualized during the exam.  Cardiac  stress MRI 08/10/15 (care everywhere):  1. Left ventricular size is normal with normal LV wall thickness.LVfunction is low normal. The LV ejection fraction (EF) is 50%.  2. No evidence of perfusion defect seen on rest or stress imaging.There is no late gadolinium enhancement 3. The right ventricle is normal size with normal systolic function. 4.Normal atrial size bilaterally.  5.No evidence of significant valvular abnormalities. 6.Normal pericardium with no pericardial effusion.   Past Medical History:  Diagnosis Date  . Alcoholism (HCC)   . Cyst of right clavicle    Labral  . Diabetes mellitus without complication (HCC)    Type II  . GERD (gastroesophageal reflux disease)   . Hypertension   . Migraines   . 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 Rotator cuff tear, right     Past Surgical History:  Procedure Laterality Date  . ABDOMINAL HYSTERECTOMY    . CHOLECYSTECTOMY    . ESOPHAGOGASTRODUODENOSCOPY (EGD) WITH PROPOFOL N/A 03/26/2015   Procedure: ESOPHAGOGASTRODUODENOSCOPY (EGD) WITH PROPOFOL;  Surgeon: Iva Boop, MD;  Location: [facility] ENDOSCOPY;  Service: Endoscopy;  Laterality: N/A;  . GASTRIC BYPASS    . TONSILLECTOMY      MEDICATIONS: . acyclovir (ZOVIRAX) 400 MG tablet  . ascorbic acid (VITAMIN C) 500 MG tablet  . aspirin EC 81 MG tablet  . Aspirin-Salicylamide-Caffeine (BC  HEADACHE POWDER PO)  . atorvastatin (LIPITOR) 20 MG tablet  . cholecalciferol (VITAMIN D) 1000 units tablet  . Cyanocobalamin (B-12) 1000 MCG TABS  . folic acid (FOLVITE) 1 MG tablet  . furosemide (LASIX) 40 MG tablet  . hydrochlorothiazide (HYDRODIURIL) 25 MG tablet  . metFORMIN (GLUCOPHAGE) 500 MG tablet  . metoprolol succinate (TOPROL-XL) 25 MG 24 hr tablet  . Multiple Vitamin (MULTIVITAMIN WITH MINERALS) TABS tablet  .  polyethylene glycol (MIRALAX) packet  . sucralfate (CARAFATE) 1 g tablet  . SUMAtriptan (IMITREX) 50 MG tablet  . topiramate (TOPAMAX) 100 MG tablet  . traMADol (ULTRAM) 50 MG tablet   No current facility-administered medications for this encounter.    - Last dose ASA 08/02/17   If EKG acceptable day of surgery, I anticipate pt can proceed with surgery as scheduled.  Rica Mast, FNP-BC [facility] Short Stay Surgical Center/Anesthesiology Phone: [phone] 08/02/2017 12:08 PM

## 2017-08-09 ENCOUNTER — Encounter (HOSPITAL_COMMUNITY)
Admission: RE | Disposition: A | Discharge status: Home / Self Care | Payer: Self-pay | Source: Ambulatory Visit | Attending: Orthopedic Surgery

## 2017-08-09 ENCOUNTER — Other Ambulatory Visit: Payer: Self-pay

## 2017-08-09 ENCOUNTER — Ambulatory Visit (HOSPITAL_COMMUNITY): Payer: PRIVATE HEALTH INSURANCE | Admitting: Certified Registered Nurse Anesthetist

## 2017-08-09 ENCOUNTER — Ambulatory Visit (HOSPITAL_COMMUNITY)
Admission: RE | Admit: 2017-08-09 | Discharge: 2017-08-09 | Disposition: A | Discharge status: Home / Self Care | Payer: PRIVATE HEALTH INSURANCE | Source: Ambulatory Visit | Attending: Orthopedic Surgery | Admitting: Orthopedic Surgery

## 2017-08-09 ENCOUNTER — Ambulatory Visit (HOSPITAL_COMMUNITY): Payer: PRIVATE HEALTH INSURANCE | Admitting: Emergency Medicine

## 2017-08-09 DIAGNOSIS — E119 Type 2 diabetes mellitus without complications: Secondary | ICD-10-CM | POA: Diagnosis not present

## 2017-08-09 DIAGNOSIS — Z91040 Latex allergy status: Secondary | ICD-10-CM | POA: Diagnosis not present

## 2017-08-09 DIAGNOSIS — M75111 Incomplete rotator cuff tear or rupture of right shoulder, not specified as traumatic: Secondary | ICD-10-CM | POA: Insufficient documentation

## 2017-08-09 DIAGNOSIS — Z888 Allergy status to other drugs, medicaments and biological substances status: Secondary | ICD-10-CM | POA: Diagnosis not present

## 2017-08-09 DIAGNOSIS — M19011 Primary osteoarthritis, right shoulder: Secondary | ICD-10-CM | POA: Insufficient documentation

## 2017-08-09 DIAGNOSIS — Z79899 Other long term (current) drug therapy: Secondary | ICD-10-CM | POA: Diagnosis not present

## 2017-08-09 DIAGNOSIS — S43431A Superior glenoid labrum lesion of right shoulder, initial encounter: Secondary | ICD-10-CM | POA: Diagnosis not present

## 2017-08-09 DIAGNOSIS — E669 Obesity, unspecified: Secondary | ICD-10-CM | POA: Diagnosis not present

## 2017-08-09 DIAGNOSIS — Z882 Allergy status to sulfonamides status: Secondary | ICD-10-CM | POA: Diagnosis not present

## 2017-08-09 DIAGNOSIS — F329 Major depressive disorder, single episode, unspecified: Secondary | ICD-10-CM | POA: Insufficient documentation

## 2017-08-09 DIAGNOSIS — M7541 Impingement syndrome of right shoulder: Secondary | ICD-10-CM | POA: Diagnosis not present

## 2017-08-09 DIAGNOSIS — X58XXXA Exposure to other specified factors, initial encounter: Secondary | ICD-10-CM | POA: Diagnosis not present

## 2017-08-09 DIAGNOSIS — Z7982 Long term (current) use of aspirin: Secondary | ICD-10-CM | POA: Diagnosis not present

## 2017-08-09 DIAGNOSIS — Z885 Allergy status to narcotic agent status: Secondary | ICD-10-CM | POA: Diagnosis not present

## 2017-08-09 DIAGNOSIS — Z6831 Body mass index (BMI) 31.0-31.9, adult: Secondary | ICD-10-CM | POA: Diagnosis not present

## 2017-08-09 DIAGNOSIS — I10 Essential (primary) hypertension: Secondary | ICD-10-CM | POA: Insufficient documentation

## 2017-08-09 DIAGNOSIS — K219 Gastro-esophageal reflux disease without esophagitis: Secondary | ICD-10-CM | POA: Diagnosis not present

## 2017-08-09 DIAGNOSIS — Z7984 Long term (current) use of oral hypoglycemic drugs: Secondary | ICD-10-CM | POA: Diagnosis not present

## 2017-08-09 HISTORY — PX: SHOULDER ARTHROSCOPY WITH ROTATOR CUFF REPAIR: SHX5685

## 2017-08-09 LAB — GLUCOSE, CAPILLARY
GLUCOSE-CAPILLARY: 78 mg/dL (ref 65–99)
GLUCOSE-CAPILLARY: 155 mg/dL — AB (ref 65–99)

## 2017-08-09 SURGERY — ARTHROSCOPY, SHOULDER, WITH ROTATOR CUFF REPAIR
Anesthesia: General | Site: Shoulder | Laterality: Right

## 2017-08-09 MED ORDER — ACETAMINOPHEN 325 MG PO TABS: 325.0000 mg | ORAL_TABLET | ORAL | Status: DC | PRN

## 2017-08-09 MED ORDER — BUPIVACAINE HCL (PF) 0.5 % IJ SOLN
INTRAMUSCULAR | Status: DC | PRN
  Administered 2017-08-09: 15 mL via PERINEURAL

## 2017-08-09 MED ORDER — ACETAMINOPHEN 160 MG/5ML PO SOLN: 325.0000 mg | ORAL | Status: DC | PRN

## 2017-08-09 MED ORDER — PROPOFOL 10 MG/ML IV BOLUS
INTRAVENOUS | Status: DC | PRN
  Administered 2017-08-09: 150 mg via INTRAVENOUS

## 2017-08-09 MED ORDER — PHENYLEPHRINE 40 MCG/ML (10ML) SYRINGE FOR IV PUSH (FOR BLOOD PRESSURE SUPPORT)
PREFILLED_SYRINGE | INTRAVENOUS | Status: AC
  Filled 2017-08-09: qty 10

## 2017-08-09 MED ORDER — MIDAZOLAM HCL 2 MG/2ML IJ SOLN
INTRAMUSCULAR | Status: AC
  Administered 2017-08-09: 2 mg
  Filled 2017-08-09: qty 2

## 2017-08-09 MED ORDER — POVIDONE-IODINE 7.5 % EX SOLN
Freq: Once | CUTANEOUS | Status: DC
  Filled 2017-08-09: qty 118

## 2017-08-09 MED ORDER — SODIUM CHLORIDE 0.9 % IR SOLN
Status: DC | PRN
  Administered 2017-08-09: 6000 mL

## 2017-08-09 MED ORDER — DEXMEDETOMIDINE HCL IN NACL 200 MCG/50ML IV SOLN
INTRAVENOUS | Status: DC | PRN
  Administered 2017-08-09: 24 ug via INTRAVENOUS

## 2017-08-09 MED ORDER — STERILE WATER FOR IRRIGATION IR SOLN
Status: DC | PRN
  Administered 2017-08-09: 1000 mL

## 2017-08-09 MED ORDER — SUGAMMADEX SODIUM 200 MG/2ML IV SOLN
INTRAVENOUS | Status: AC
  Filled 2017-08-09: qty 4

## 2017-08-09 MED ORDER — DEXTROSE 50 % IV SOLN
25.0000 mL | Freq: Once | INTRAVENOUS | Status: DC
  Filled 2017-08-09: qty 50

## 2017-08-09 MED ORDER — EPHEDRINE SULFATE 50 MG/ML IJ SOLN
INTRAMUSCULAR | Status: DC | PRN
  Administered 2017-08-09: 10 mg via INTRAVENOUS

## 2017-08-09 MED ORDER — BUPIVACAINE LIPOSOME 1.3 % IJ SUSP
INTRAMUSCULAR | Status: DC | PRN
  Administered 2017-08-09: 10 mL via PERINEURAL

## 2017-08-09 MED ORDER — SUGAMMADEX SODIUM 200 MG/2ML IV SOLN
INTRAVENOUS | Status: AC
  Filled 2017-08-09: qty 2

## 2017-08-09 MED ORDER — SUGAMMADEX SODIUM 200 MG/2ML IV SOLN
INTRAVENOUS | Status: DC | PRN
  Administered 2017-08-09: 200 mg via INTRAVENOUS

## 2017-08-09 MED ORDER — DEXMEDETOMIDINE HCL IN NACL 200 MCG/50ML IV SOLN
INTRAVENOUS | Status: AC
  Filled 2017-08-09: qty 50

## 2017-08-09 MED ORDER — MEPERIDINE HCL 50 MG/ML IJ SOLN: 6.2500 mg | INTRAMUSCULAR | Status: DC | PRN

## 2017-08-09 MED ORDER — CEFAZOLIN SODIUM-DEXTROSE 2-4 GM/100ML-% IV SOLN
2.0000 g | INTRAVENOUS | Status: AC
  Administered 2017-08-09: 2 g via INTRAVENOUS

## 2017-08-09 MED ORDER — SODIUM CHLORIDE 0.9 % IJ SOLN
INTRAMUSCULAR | Status: AC
  Filled 2017-08-09: qty 20

## 2017-08-09 MED ORDER — ROCURONIUM BROMIDE 100 MG/10ML IV SOLN
INTRAVENOUS | Status: DC | PRN
  Administered 2017-08-09: 50 mg via INTRAVENOUS
  Administered 2017-08-09: 10 mg via INTRAVENOUS

## 2017-08-09 MED ORDER — OXYCODONE HCL 5 MG/5ML PO SOLN: 5.0000 mg | Freq: Once | ORAL | Status: DC | PRN

## 2017-08-09 MED ORDER — FENTANYL CITRATE (PF) 250 MCG/5ML IJ SOLN
INTRAMUSCULAR | Status: AC
  Filled 2017-08-09: qty 5

## 2017-08-09 MED ORDER — PHENYLEPHRINE HCL 10 MG/ML IJ SOLN
INTRAVENOUS | Status: DC | PRN
  Administered 2017-08-09: 40 ug/min via INTRAVENOUS

## 2017-08-09 MED ORDER — LIDOCAINE 2% (20 MG/ML) 5 ML SYRINGE
INTRAMUSCULAR | Status: AC
  Filled 2017-08-09: qty 15

## 2017-08-09 MED ORDER — OXYCODONE HCL 5 MG PO TABS: 5.0000 mg | ORAL_TABLET | Freq: Once | ORAL | Status: DC | PRN

## 2017-08-09 MED ORDER — LACTATED RINGERS IV SOLN
INTRAVENOUS | Status: DC
  Administered 2017-08-09: 50 mL/h via INTRAVENOUS

## 2017-08-09 MED ORDER — DEXAMETHASONE SODIUM PHOSPHATE 10 MG/ML IJ SOLN
INTRAMUSCULAR | Status: AC
  Filled 2017-08-09: qty 3

## 2017-08-09 MED ORDER — OXYCODONE-ACETAMINOPHEN 5-325 MG PO TABS: 1.0000 | ORAL_TABLET | ORAL | 0 refills | Status: AC | PRN

## 2017-08-09 MED ORDER — FENTANYL CITRATE (PF) 100 MCG/2ML IJ SOLN: 25.0000 ug | INTRAMUSCULAR | Status: DC | PRN

## 2017-08-09 MED ORDER — CEFAZOLIN SODIUM-DEXTROSE 2-4 GM/100ML-% IV SOLN
INTRAVENOUS | Status: AC
  Filled 2017-08-09: qty 100

## 2017-08-09 MED ORDER — ROCURONIUM BROMIDE 10 MG/ML (PF) SYRINGE
PREFILLED_SYRINGE | INTRAVENOUS | Status: AC
  Filled 2017-08-09: qty 15

## 2017-08-09 MED ORDER — EPHEDRINE SULFATE 50 MG/ML IJ SOLN
INTRAMUSCULAR | Status: AC
  Filled 2017-08-09: qty 1

## 2017-08-09 MED ORDER — ONDANSETRON HCL 4 MG/2ML IJ SOLN
INTRAMUSCULAR | Status: DC | PRN
  Administered 2017-08-09: 4 mg via INTRAVENOUS

## 2017-08-09 MED ORDER — DEXTROSE 50 % IV SOLN
INTRAVENOUS | Status: AC
  Administered 2017-08-09: 25 mL
  Filled 2017-08-09: qty 50

## 2017-08-09 MED ORDER — METHOCARBAMOL 500 MG PO TABS: 500.0000 mg | ORAL_TABLET | Freq: Three times a day (TID) | ORAL | 0 refills | Status: DC

## 2017-08-09 MED ORDER — ONDANSETRON HCL 4 MG/2ML IJ SOLN
INTRAMUSCULAR | Status: AC
  Filled 2017-08-09: qty 6

## 2017-08-09 MED ORDER — ONDANSETRON HCL 4 MG/2ML IJ SOLN: 4.0000 mg | Freq: Once | INTRAMUSCULAR | Status: DC | PRN

## 2017-08-09 MED ORDER — FENTANYL CITRATE (PF) 100 MCG/2ML IJ SOLN
INTRAMUSCULAR | Status: AC
  Administered 2017-08-09: 100 ug
  Filled 2017-08-09: qty 2

## 2017-08-09 MED ORDER — DEXAMETHASONE SODIUM PHOSPHATE 4 MG/ML IJ SOLN
INTRAMUSCULAR | Status: DC | PRN
  Administered 2017-08-09: 10 mg via INTRAVENOUS

## 2017-08-09 SURGICAL SUPPLY — 58 items
BLADE SURG 11 STRL SS (BLADE) ×3 IMPLANT
BURR OVAL 8 FLU 4.0MM X 13CM (MISCELLANEOUS) ×1
BURR OVAL 8 FLU 4.0X13 (MISCELLANEOUS) ×2 IMPLANT
CANNULA 5.75X71 LONG (CANNULA) ×9 IMPLANT
CHLORAPREP W/TINT 26ML (MISCELLANEOUS) ×3 IMPLANT
COVER SURGICAL LIGHT HANDLE (MISCELLANEOUS) ×3 IMPLANT
CUTTER BONE 4.0MM X 13CM (MISCELLANEOUS) ×3 IMPLANT
DRAPE INCISE IOBAN 66X45 STRL (DRAPES) ×3 IMPLANT
DRAPE STERI 35X30 U-POUCH (DRAPES) ×3 IMPLANT
DRAPE SURG 17X23 STRL (DRAPES) ×3 IMPLANT
DRAPE U-SHAPE 47X51 STRL (DRAPES) ×3 IMPLANT
DRAPE U-SHAPE 76X120 STRL (DRAPES) ×6 IMPLANT
DRSG EMULSION OIL 3X3 NADH (GAUZE/BANDAGES/DRESSINGS) ×6 IMPLANT
DRSG PAD ABDOMINAL 8X10 ST (GAUZE/BANDAGES/DRESSINGS) ×6 IMPLANT
GAUZE SPONGE 4X4 12PLY STRL (GAUZE/BANDAGES/DRESSINGS) ×3 IMPLANT
GAUZE XEROFORM 1X8 LF (GAUZE/BANDAGES/DRESSINGS) ×3 IMPLANT
GLOVE BIO SURGEON STRL SZ7 (GLOVE) ×3 IMPLANT
GLOVE BIO SURGEON STRL SZ7.5 (GLOVE) ×3 IMPLANT
GLOVE BIOGEL PI IND STRL 7.0 (GLOVE) ×1 IMPLANT
GLOVE BIOGEL PI IND STRL 8 (GLOVE) ×1 IMPLANT
GLOVE BIOGEL PI INDICATOR 7.0 (GLOVE) ×2
GLOVE BIOGEL PI INDICATOR 8 (GLOVE) ×2
GOWN STRL REUS W/ TWL LRG LVL3 (GOWN DISPOSABLE) ×3 IMPLANT
GOWN STRL REUS W/TWL LRG LVL3 (GOWN DISPOSABLE) ×6
KIT BASIN OR (CUSTOM PROCEDURE TRAY) ×3 IMPLANT
KIT TURNOVER KIT B (KITS) ×3 IMPLANT
MANIFOLD NEPTUNE II (INSTRUMENTS) ×3 IMPLANT
NEEDLE HYPO 25GX1X1/2 BEV (NEEDLE) ×3 IMPLANT
NEEDLE SCORPION MULTI FIRE (NEEDLE) IMPLANT
NEEDLE SPNL 18GX3.5 QUINCKE PK (NEEDLE) ×3 IMPLANT
NS IRRIG 1000ML POUR BTL (IV SOLUTION) ×3 IMPLANT
PACK SHOULDER (CUSTOM PROCEDURE TRAY) ×3 IMPLANT
PAD ABD 8X10 STRL (GAUZE/BANDAGES/DRESSINGS) ×3 IMPLANT
PAD ARMBOARD 7.5X6 YLW CONV (MISCELLANEOUS) ×6 IMPLANT
PROBE APOLLO 90XL (SURGICAL WAND) ×3 IMPLANT
RESECTOR FULL RADIUS 4.2MM (BLADE) ×3 IMPLANT
RESTRAINT HEAD UNIVERSAL NS (MISCELLANEOUS) ×3 IMPLANT
SLING ARM FOAM STRAP LRG (SOFTGOODS) ×3 IMPLANT
SLING ARM FOAM STRAP MED (SOFTGOODS) IMPLANT
SLING ARM IMMOBILIZER LRG (SOFTGOODS) ×3 IMPLANT
SPONGE LAP 4X18 RFD (DISPOSABLE) IMPLANT
SUPPORT WRAP ARM LG (MISCELLANEOUS) IMPLANT
SUT 2 FIBERLOOP 20 STRT BLUE (SUTURE)
SUT ETHILON 3 0 PS 1 (SUTURE) ×3 IMPLANT
SUT FIBERWIRE #2 38 T-5 BLUE (SUTURE)
SUT TIGER TAPE 7 IN WHITE (SUTURE) IMPLANT
SUTURE 2 FIBERLOOP 20 STRT BLU (SUTURE) IMPLANT
SUTURE FIBERWR #2 38 T-5 BLUE (SUTURE) IMPLANT
SYR CONTROL 10ML LL (SYRINGE) ×3 IMPLANT
TAPE CLOTH SURG 6X10 WHT LF (GAUZE/BANDAGES/DRESSINGS) ×3 IMPLANT
TAPE FIBER 2MM 7IN #2 BLUE (SUTURE) IMPLANT
TOWEL OR 17X24 6PK STRL BLUE (TOWEL DISPOSABLE) ×3 IMPLANT
TOWEL OR 17X26 10 PK STRL BLUE (TOWEL DISPOSABLE) ×3 IMPLANT
TUBE CONNECTING 12'X1/4 (SUCTIONS) ×1
TUBE CONNECTING 12X1/4 (SUCTIONS) ×2 IMPLANT
TUBING ARTHROSCOPY IRRIG 16FT (MISCELLANEOUS) ×3 IMPLANT
WAND HAND CNTRL MULTIVAC 90 (MISCELLANEOUS) ×3 IMPLANT
WATER STERILE IRR 1000ML POUR (IV SOLUTION) ×3 IMPLANT

## 2017-08-09 NOTE — Op Note (Signed)
Procedure(s):   Janice Santos female 52 y.o. 08/09/2017  Procedure(s) and Anesthesia Type:    *#1 RIGHT SHOULDER ARTHROSCOPY WITH ROTATOR CUFF debridement, debridement posterior superior anterior inferior labral tears #2 right shoulder arthroscopic SUBACROMIAL DECOMPRESSION,  #3 right shoulder arthroscopic DISTAL CLAVICAL EXCISION  Postoperative diagnosis: Right shoulder partial-thickness rotator cuff tear, circumferential labral tears, impingement, AC joint DJD.  Surgeon(s) and Role:    Jones Broom, MD - Primary     Surgeon: Berline Lopes   Assistants: Damita Lack PA-C [facility] was present and scrubbed throughout the procedure and was essential in positioning, assisting with the camera and instrumentation,, and closure)  Anesthesia: General endotracheal anesthesia with preoperative interscalene block given by the attending anesthesiologist     Procedure Detail  RIGHT SHOULDER ARTHROSCOPY WITH ROTATOR CUFF REPAIR, SUBACROMIAL DECOMPRESSION, DISTAL CLAVICAL EXCISION, DEBRIDEMENT OF LABRAL CYST  Estimated Blood Loss: Min         Drains: none  Blood Given: none         Specimens: none        Complications:  * No complications entered in OR log *         Disposition: PACU - hemodynamically stable.         Condition: stable    Procedure:   INDICATIONS FOR SURGERY: The patient is 52 y.o. female who has a 2-year history of right shoulder pain, failed extensive conservative management with injections, therapy, medications, rest.  Indicated for surgical management to try to decrease pain and restore function.  OPERATIVE FINDINGS: Examination under anesthesia: No stiffness or instability   DESCRIPTION OF PROCEDURE: The patient was identified in preoperative  holding area where I personally marked the operative site after  verifying site, side, and procedure with the patient. An interscalene block was given by the attending anesthesiologist the  holding area.  The patient was taken back to the operating room where general anesthesia was induced without complication and was placed in the beach-chair position with the back  elevated about 60 degrees and all extremities and head and neck carefully padded and  positioned.   The right upper extremity was then prepped and  draped in a standard sterile fashion. The appropriate time-out  procedure was carried out. The patient did receive IV antibiotics  within 30 minutes of incision.   A small posterior portal incision was made and the arthroscope was introduced into the joint. An anterior portal was then established above the subscapularis using needle localization. Small cannula was placed anteriorly. Diagnostic arthroscopy was then carried out.  Subscapularis was noted to be intact.  The biceps tendon was intact.  There was some tearing of the superior labrum with some flaps hanging down to the joint.  This extended around posteriorly.  After extensive debridement the labrum was grossly intact and biceps anchor was not significantly elevated.  The biceps tendon was pulled into the joint and healthy-appearing.  There was also a flap tear of the anterior inferior labrum which was debrided.  Around the most inferior aspect of the joint was unable to appreciate any communication with a cyst.  I felt that with debridement of the labral tears as seen this would likely ultimately alleviate the cyst and I did not probe further to try and open this area.  The undersurface of the supraspinatus had some partial tearing which was debrided back to healthy bleeding tendon.  This involved less than 5% of the articular surface.  Infraspinatus was intact.  The arthroscope was  then introduced into the subacromial space a standard lateral portal was established with needle localization. The shaver was used through the lateral portal to perform extensive bursectomy. Coracoacromial ligament was examined and found to  be frayed indicating chronic impingement.  The bursal surface of the rotator cuff was carefully examined and probed and noted to be largely intact with mild fraying which was debrided.  There was no areas of exposed tuberosity.  Ultimately I felt that debridement was appropriate and repair was not necessary.  The coracoacromial ligament was taken down off the anterior acromion with the ArthroCare exposing a hooked anterior acromial spur. A high-speed bur was then used through the lateral portal to take down the anterior acromial spur from lateral to medial in a standard acromioplasty.  The acromioplasty was also viewed from the lateral portal and the bur was used as necessary to ensure that the acromion was completely flat from posterior to anterior.  The distal clavicle was exposed arthroscopically and the bur was used to take off the undersurface for approximately 8 mm from the lateral portal. The bur was then moved to an anterior portal position to complete the distal clavicle excision resecting about 8 mm of the distal clavicle and a smooth even fashion. This was viewed from anterior and lateral portals and felt to be complete.  The arthroscopic equipment was removed from the joint and the portals were closed with 3-0 nylon in an interrupted fashion. Sterile dressings were then applied including Xeroform 4 x 4's ABDs and tape. The patient was then allowed to awaken from general anesthesia, placed in a sling, transferred to the stretcher and taken to the recovery room in stable condition.   POSTOPERATIVE PLAN: The patient will be discharged home today and will followup in one week for suture removal and wound check.

## 2017-08-09 NOTE — Anesthesia Procedure Notes (Signed)
Procedure Name: Intubation Date/Time: 08/09/2017 1:49 PM Performed by: Glynda Jaeger, CRNA Pre-anesthesia Checklist: Patient identified, Patient being monitored, Timeout performed, Emergency Drugs available and Suction available Patient Re-evaluated:Patient Re-evaluated prior to induction Oxygen Delivery Method: Circle System Utilized Preoxygenation: Pre-oxygenation with 100% oxygen Induction Type: IV induction Ventilation: Mask ventilation without difficulty Laryngoscope Size: Mac and 3 Grade View: Grade I Tube type: Oral Tube size: 7.5 mm Number of attempts: 1 Airway Equipment and Method: Stylet Placement Confirmation: ETT inserted through vocal cords under direct vision,  positive ETCO2 and breath sounds checked- equal and bilateral Secured at: 21 cm Tube secured with: Tape Dental Injury: Teeth and Oropharynx as per pre-operative assessment

## 2017-08-09 NOTE — Anesthesia Preprocedure Evaluation (Addendum)
Anesthesia Evaluation  Patient identified by MRN, date of birth, ID band Patient awake    Reviewed: Allergy & Precautions, NPO status , Patient's Chart, lab work & pertinent test results  Airway Mallampati: I  TM Distance: >3 FB Neck ROM: Full    Dental no notable dental hx. (+) Teeth Intact   Pulmonary neg pulmonary ROS,    Pulmonary exam normal breath sounds clear to auscultation       Cardiovascular Exercise Tolerance: Good hypertension, Pt. on medications Normal cardiovascular exam Rhythm:Regular Rate:Normal     Neuro/Psych  Headaches, PSYCHIATRIC DISORDERS Depression    GI/Hepatic Neg liver ROS, GERD  Medicated,  Endo/Other  diabetes, Type 2, Oral Hypoglycemic Agents  Renal/GU negative Renal ROS  negative genitourinary   Musculoskeletal negative musculoskeletal ROS (+)   Abdominal (+) + obese,   Peds negative pediatric ROS (+)  Hematology negative hematology ROS (+)   Anesthesia Other Findings   Reproductive/Obstetrics negative OB ROS                           Anesthesia Physical  Anesthesia Plan  ASA: II  Anesthesia Plan: General   Post-op Pain Management: GA combined w/ Regional for post-op pain   Induction: Intravenous  PONV Risk Score and Plan: 3 and Ondansetron, Dexamethasone and Treatment may vary due to age or medical condition  Airway Management Planned: Oral ETT and LMA  Additional Equipment:   Intra-op Plan:   Post-operative Plan: Extubation in OR  Informed Consent: I have reviewed the patients History and Physical, chart, labs and discussed the procedure including the risks, benefits and alternatives for the proposed anesthesia with the patient or authorized representative who has indicated his/her understanding and acceptance.   Dental advisory given  Plan Discussed with: CRNA, Anesthesiologist and Surgeon  Anesthesia Plan Comments: (Discussed both nerve  block for pain relief post-op and GA; including NV, sore throat, dental injury, and pulmonary complications)       Anesthesia Quick Evaluation

## 2017-08-09 NOTE — H&P (Signed)
Janice Santos is an 52 y.o. female.   Chief Complaint: R shoulder pain and dysfunction  HPI: Chronic right shoulder pain with high-grade partial rotator cuff tear and symptomatic AC joint arthritis, failed conservative measures.  Past Medical History:  Diagnosis Date  . Alcoholism (HCC)   . Cyst of right clavicle    Labral  . Diabetes mellitus without complication (HCC)    Type II  . GERD (gastroesophageal reflux disease)   . Hypertension   . Migraines   . 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 Rotator cuff tear, right     Past Surgical History:  Procedure Laterality Date  . ABDOMINAL HYSTERECTOMY    . CHOLECYSTECTOMY    . ESOPHAGOGASTRODUODENOSCOPY (EGD) WITH PROPOFOL N/A 03/26/2015   Procedure: ESOPHAGOGASTRODUODENOSCOPY (EGD) WITH PROPOFOL;  Surgeon: Iva Boop, MD;  Location: [facility];  Service: Endoscopy;  Laterality: N/A;  . GASTRIC BYPASS    . TONSILLECTOMY      Family History  Problem Relation Age of Onset  . Diabetes Mother   . Hypertension Mother   . Diabetes Father   . Hypertension Father   . Diabetes Brother   . Hypertension Brother   . Diabetes Sister    Social History:  reports that she has never smoked. She has never used smokeless tobacco. She reports that she does not drink alcohol or use drugs.  Allergies:  Allergies  Allergen Reactions  . Lisinopril Swelling and Other (See Comments)    ANGIOEDEMA  . Morphine And Related Hives and Itching  . Peach [Prunus Persica] Swelling    Face and Eyes  . Sulfa Antibiotics Hives and Itching  . Sulfasalazine Hives and Itching  . Tape Other (See Comments)    Adhesive tape removes the skin, use paper tape  . Latex Rash    Powder form: causes a rash    Medications Prior to Admission  Medication Sig Dispense Refill  . acyclovir (ZOVIRAX) 400 MG tablet Take 800 mg by mouth daily.     Marland Kitchen ascorbic acid (VITAMIN C) 500 MG tablet Take 500 mg by mouth daily.    Marland Kitchen  aspirin EC 81 MG tablet Take 1 tablet (81 mg total) by mouth daily. 30 tablet 0  . Aspirin-Salicylamide-Caffeine (BC HEADACHE POWDER PO) Take 1 Package by mouth daily as needed (shoulder pain).    Marland Kitchen atorvastatin (LIPITOR) 20 MG tablet Take 20 mg by mouth daily.    . cholecalciferol (VITAMIN D) 1000 units tablet Take 2,000 Units by mouth daily.     . Cyanocobalamin (B-12) 1000 MCG TABS Take 2,000 mcg by mouth daily.     . folic acid (FOLVITE) 1 MG tablet Take 1 mg by mouth every morning.    . metFORMIN (GLUCOPHAGE) 500 MG tablet Take 1 tablet (500 mg total) by mouth 3 (three) times daily.    . metoprolol succinate (TOPROL-XL) 25 MG 24 hr tablet Take 25 mg by mouth 2 (two) times daily.    . Multiple Vitamin (MULTIVITAMIN WITH MINERALS) TABS tablet Take 1 tablet by mouth every morning.     . furosemide (LASIX) 40 MG tablet Take 40 mg by mouth daily.    . hydrochlorothiazide (HYDRODIURIL) 25 MG tablet Take 1 tablet (25 mg total) by mouth daily. (Patient not taking: Reported on 07/23/2017) 30 tablet 1  . polyethylene glycol (MIRALAX) packet Take 17 g by mouth daily. (Patient not taking: Reported on 07/23/2017) 14 each 0  . sucralfate (CARAFATE) 1 g  tablet Take 1 tablet (1 g total) by mouth 4 (four) times daily -  with meals and at bedtime. (Patient not taking: Reported on 07/23/2017) 28 tablet 0  . SUMAtriptan (IMITREX) 50 MG tablet Take 50 mg by mouth daily as needed for migraine.    . topiramate (TOPAMAX) 100 MG tablet TAKE 200 MG BY MOUTH AT BEDTIME  1  . traMADol (ULTRAM) 50 MG tablet Take 1 tablet (50 mg total) by mouth every 6 (six) hours as needed for severe pain. (Patient not taking: Reported on 07/23/2017) 15 tablet 0    No results found for this or any previous visit (from the past 48 hour(s)). No results found.  Review of Systems  All other systems reviewed and are negative.   Blood pressure 140/65, pulse 66, temperature 98.3 F (36.8 C), resp. rate 20, SpO2 100 %. Physical Exam   Constitutional: She is oriented to person, place, and time. She appears well-developed and well-nourished.  HENT:  Head: Atraumatic.  Eyes: EOM are normal.  Cardiovascular: Intact distal pulses.  Respiratory: Effort normal.  Musculoskeletal:  R shoulder pain with RC testing. [facility]  Neurological: She is alert and oriented to person, place, and time.  Skin: Skin is warm and dry.  Psychiatric: She has a normal mood and affect.     Assessment/Plan Chronic right shoulder pain with high-grade partial rotator cuff tear and symptomatic AC joint arthritis, failed conservative measures. Plan R arth debride vs repair, SAD DCR Risks / benefits of surgery discussed Consent on chart  NPO for OR Preop antibiotics   Berline LopesJustin W Alannie Amodio, MD 08/09/2017, 1:26 PM

## 2017-08-09 NOTE — Discharge Instructions (Signed)
Discharge Instructions after Arthroscopic Shoulder Surgery   A sling has been provided for you. You may remove the sling after 72 hours. The sling may be worn for your protection, if you are in a crowd.  Use ice on the shoulder intermittently over the first 48 hours after surgery.  Pain medication has been prescribed for you.  Use your medication liberally over the first 48 hours, and then begin to taper your use. You may take Extra Strength Tylenol or Tylenol only in place of the pain pills. You may remove your dressing after two days.  You may shower 5 days after surgery. The incision CANNOT get wet prior to 5 days. Simply allow the water to wash over the site and then pat dry. Do not rub the incision. Make sure your axilla (armpit) is completely dry after showering.  Take one aspirin a day for 2 weeks after surgery, unless you have an aspirin sensitivity/allergy or asthma.  Three to 5 times each day you should perform assisted overhead reaching and external rotation (outward turning) exercises with the operative arm. Both exercises should be done with the non-operative arm used as the "therapist arm" while the operative arm remains relaxed. Ten of each exercise should be done three to five times each day.    Overhead reach is helping to lift your stiff arm up as high as it will go. To stretch your overhead reach, lie flat on your back, relax, and grasp the wrist of the tight shoulder with your opposite hand. Using the power in your opposite arm, bring the stiff arm up as far as it is comfortable. Start holding it for ten seconds and then work up to where you can hold it for a count of 30. Breathe slowly and deeply while the arm is moved. Repeat this stretch ten times, trying to help the arm up a little higher each time.       External rotation is turning the arm out to the side while your elbow stays close to your body. External rotation is best stretched while you are lying on your back.  Hold a cane, yardstick, broom handle, or dowel in both hands. Bend both elbows to a right angle. Use steady, gentle force from your normal arm to rotate the hand of the stiff shoulder out away from your body. Continue the rotation as far as it will go comfortably, holding it there for a count of 10. Repeat this exercise ten times.     Please call [phone] during normal business hours or [phone] after hours for any problems. Including the following:  - excessive redness of the incisions - drainage for more than 4 days - fever of more than 101.5 F  *Please note that pain medications will not be refilled after hours or on weekends.

## 2017-08-09 NOTE — Anesthesia Procedure Notes (Signed)
Anesthesia Regional Block: Interscalene brachial plexus block   Pre-Anesthetic Checklist: ,, timeout performed, Correct Patient, Correct Site, Correct Laterality, Correct Procedure, Correct Position, site marked, Risks and benefits discussed,  Surgical consent,  Pre-op evaluation,  At surgeon's request and post-op pain management  Laterality: Right  Prep: chloraprep       Needles:  Injection technique: Single-shot  Needle Type: Echogenic Stimulator Needle     Needle Length: 5cm  Needle Gauge: 22     Additional Needles:   Procedures:, nerve stimulator,,, ultrasound used (permanent image in chart),,,,   Nerve Stimulator or Paresthesia:  Response: bic, 0.45 mA,   Additional Responses:   Narrative:  Start time: 08/09/2017 11:55 AM End time: 08/09/2017 12:00 PM Injection made incrementally with aspirations every 5 mL.  Performed by: Personally  Anesthesiologist: Bethena Midgetddono, Jimesha Rising, MD  Additional Notes: Functioning IV was confirmed and monitors were applied.  A 50mm 22ga Arrow echogenic stimulator needle was used. Sterile prep and drape,hand hygiene and sterile gloves were used. Ultrasound guidance: relevant anatomy identified, needle position confirmed, local anesthetic spread visualized around nerve(s)., vascular puncture avoided.  Image printed for medical record. Negative aspiration and negative test dose prior to incremental administration of local anesthetic. The patient tolerated the procedure well.

## 2017-08-09 NOTE — Transfer of Care (Signed)
Immediate Anesthesia Transfer of Care Note  Patient: Janice Santos  Procedure(s) Performed: RIGHT SHOULDER ARTHROSCOPY WITH ROTATOR CUFF REPAIR, SUBACROMIAL DECOMPRESSION, DISTAL CLAVICAL EXCISION, DEBRIDEMENT OF LABRAL CYST (Right Shoulder)  Patient Location: PACU  Anesthesia Type:General  Level of Consciousness: sedated  Airway & Oxygen Therapy: Patient Spontanous Breathing and Patient connected to nasal cannula oxygen  Post-op Assessment: Report given to RN and Post -op Vital signs reviewed and stable  Post vital signs: Reviewed and stable  Last Vitals:  Vitals Value Taken Time  BP 116/74 08/09/2017  3:13 PM  Temp    Pulse 74 08/09/2017  3:19 PM  Resp 18 08/09/2017  3:19 PM  SpO2 100 % 08/09/2017  3:19 PM  Vitals shown include unvalidated device data.  Last Pain:  Vitals:   08/09/17 1514  PainSc: (P) Asleep      Patients Stated Pain Goal: 3 (08/09/17 1033)  Complications: No apparent anesthesia complications

## 2017-08-09 NOTE — Anesthesia Postprocedure Evaluation (Signed)
Anesthesia Post Note  Patient: Janice Santos  Procedure(s) Performed: RIGHT SHOULDER ARTHROSCOPY WITH ROTATOR CUFF REPAIR, SUBACROMIAL DECOMPRESSION, DISTAL CLAVICAL EXCISION, DEBRIDEMENT OF LABRAL CYST (Right Shoulder)     Patient location during evaluation: PACU Anesthesia Type: General Level of consciousness: awake and alert Pain management: pain level controlled Vital Signs Assessment: post-procedure vital signs reviewed and stable Respiratory status: spontaneous breathing, nonlabored ventilation, respiratory function stable and patient connected to nasal cannula oxygen Cardiovascular status: blood pressure returned to baseline and stable Postop Assessment: no apparent nausea or vomiting Anesthetic complications: no    Last Vitals:  Vitals:   08/09/17 1603 08/09/17 1615  BP:  126/76  Pulse:  (!) 54  Resp: 14   Temp:    SpO2:  100%    Last Pain:  Vitals:   08/09/17 1615  PainSc: 0-No pain                 Sebastain Fishbaugh

## 2017-08-10 ENCOUNTER — Encounter (HOSPITAL_COMMUNITY): Payer: Self-pay | Admitting: Orthopedic Surgery

## 2018-09-25 ENCOUNTER — Emergency Department (HOSPITAL_COMMUNITY)
Admission: EM | Admit: 2018-09-25 | Discharge: 2018-09-25 | Disposition: A | Discharge status: Home / Self Care | Payer: BLUE CROSS/BLUE SHIELD | Attending: Emergency Medicine | Admitting: Emergency Medicine

## 2018-09-25 ENCOUNTER — Other Ambulatory Visit: Payer: Self-pay

## 2018-09-25 ENCOUNTER — Encounter (HOSPITAL_COMMUNITY): Payer: Self-pay

## 2018-09-25 ENCOUNTER — Emergency Department (HOSPITAL_COMMUNITY): Payer: BLUE CROSS/BLUE SHIELD

## 2018-09-25 DIAGNOSIS — E119 Type 2 diabetes mellitus without complications: Secondary | ICD-10-CM | POA: Diagnosis not present

## 2018-09-25 DIAGNOSIS — R1084 Generalized abdominal pain: Secondary | ICD-10-CM | POA: Insufficient documentation

## 2018-09-25 DIAGNOSIS — Z7984 Long term (current) use of oral hypoglycemic drugs: Secondary | ICD-10-CM | POA: Diagnosis not present

## 2018-09-25 DIAGNOSIS — I10 Essential (primary) hypertension: Secondary | ICD-10-CM | POA: Insufficient documentation

## 2018-09-25 DIAGNOSIS — Z79899 Other long term (current) drug therapy: Secondary | ICD-10-CM | POA: Diagnosis not present

## 2018-09-25 DIAGNOSIS — Z7982 Long term (current) use of aspirin: Secondary | ICD-10-CM | POA: Diagnosis not present

## 2018-09-25 DIAGNOSIS — R140 Abdominal distension (gaseous): Secondary | ICD-10-CM | POA: Diagnosis not present

## 2018-09-25 DIAGNOSIS — R109 Unspecified abdominal pain: Secondary | ICD-10-CM

## 2018-09-25 LAB — URINALYSIS, ROUTINE W REFLEX MICROSCOPIC
Bacteria, UA: NONE SEEN
Bilirubin Urine: NEGATIVE
Glucose, UA: NEGATIVE mg/dL
Hgb urine dipstick: NEGATIVE
Ketones, ur: NEGATIVE mg/dL
Nitrite: NEGATIVE
Protein, ur: NEGATIVE mg/dL
Specific Gravity, Urine: 1.013 (ref 1.005–1.030)
pH: 5 (ref 5.0–8.0)

## 2018-09-25 LAB — COMPREHENSIVE METABOLIC PANEL WITH GFR
ALT: 22 U/L (ref 0–44)
AST: 36 U/L (ref 15–41)
Albumin: 3.8 g/dL (ref 3.5–5.0)
Alkaline Phosphatase: 134 U/L — ABNORMAL HIGH (ref 38–126)
Anion gap: 8 (ref 5–15)
BUN: 12 mg/dL (ref 6–20)
CO2: 23 mmol/L (ref 22–32)
Calcium: 9 mg/dL (ref 8.9–10.3)
Chloride: 111 mmol/L (ref 98–111)
Creatinine, Ser: 0.96 mg/dL (ref 0.44–1.00)
GFR calc Af Amer: >60 mL/min (ref >60)
GFR calc non Af Amer: >60 mL/min (ref >60)
Glucose, Bld: 165 mg/dL — ABNORMAL HIGH (ref 70–99)
Potassium: 3.7 mmol/L (ref 3.5–5.1)
Sodium: 142 mmol/L (ref 135–145)
Total Bilirubin: 0.3 mg/dL (ref 0.3–1.2)
Total Protein: 6.5 g/dL (ref 6.5–8.1)

## 2018-09-25 LAB — CBC
HCT: 39.8 % (ref 36.0–46.0)
Hemoglobin: 13.1 g/dL (ref 12.0–15.0)
MCH: 30.1 pg (ref 26.0–34.0)
MCHC: 32.9 g/dL (ref 30.0–36.0)
MCV: 91.5 fL (ref 80.0–100.0)
Platelets: 274 K/uL (ref 150–400)
RBC: 4.35 MIL/uL (ref 3.87–5.11)
RDW: 12.9 % (ref 11.5–15.5)
WBC: 6.9 K/uL (ref 4.0–10.5)
nRBC: 0 % (ref 0.0–0.2)

## 2018-09-25 LAB — LIPASE, BLOOD: Lipase: 28 U/L (ref 11–51)

## 2018-09-25 MED ORDER — IOHEXOL 300 MG/ML  SOLN
100.0000 mL | Freq: Once | INTRAMUSCULAR | Status: AC | PRN
  Administered 2018-09-25: 100 mL via INTRAVENOUS

## 2018-09-25 MED ORDER — SODIUM CHLORIDE 0.9% FLUSH: 3.0000 mL | Freq: Once | INTRAVENOUS | Status: DC

## 2018-09-25 MED ORDER — ALUM & MAG HYDROXIDE-SIMETH 200-200-20 MG/5ML PO SUSP
30.0000 mL | Freq: Once | ORAL | Status: AC
  Administered 2018-09-25: 18:00:00 30 mL via ORAL
  Filled 2018-09-25: qty 30

## 2018-09-25 NOTE — Discharge Instructions (Addendum)
To help reduce constipation and promote bowel health, 1. Drink at least 64 ounces of water each day; 2. Eat plenty of fiber (fruits, vegetables, whole grains, legumes) 3. Get plenty of physical activity  If needed, you may also use daily or as needed, MiraLax (Osmotic Laxative) up to  1-2 times a day and Colace (Stool Softener aka Docusate) 100 mg up to twice a day to help with bowel movements. These medications are over the counter.  MiraLax is an Osmotic You may use other over-the-counter medications such as Dulcolax, Fleet enemas, magnesium citrate as needed for constipation. Please note that some of these medications may cause you to have abdominal cramping which is normal. If you develop severe abdominal pain, fever, vomiting, distention of your abdomen, unable to have a bowel movement for 5 days or are not passing gas, please return to the hospital.  Return to the Emergency Department for any fever, worsening pain, blood in stool, severe abdominal pain, or any other worsening or concerning symptoms.  

## 2018-09-25 NOTE — ED Triage Notes (Signed)
Onset 1 week abd swelling and pain.  Last BM yest, small.  When drinking or eating it feels stuck in upper abd.  When eats pt feels like she needs to vomit and assists with getting it up.  Onset several days NP cough.

## 2018-09-25 NOTE — ED Provider Notes (Signed)
[facility]   CSN: [id number] Arrival date & time: 09/25/18  1532    History   Chief Complaint Chief Complaint  Patient presents with   Abdominal Pain    HPI Janice Santos is a 53 y.o. female with past medical history of  alcoholism, type 2 diabetes, GERD, hypertension, migraines presents emergency department today with chief complaint of abdominal pain x1 week.  She states every time she eats she can feel food traveling through her abdomen and it causes an aching sensation.  She rates the pain 7 of 10 in severity.  She is also stating her abdomen feels more distended than usual.  She reports associated nausea without emesis.  She states she has had difficulty having bowel movements lately.  She last had one yesterday and the stool was hard and a smaller amount than usual. Also is reporting nonproductive cough x 2 days.   She denies fever, chills, chest pain, shortness of breath, urinary symptoms blood in stool, diarrhea. No sick contacts.  Abdominal surgical history includes gastric bypass and cholecystectomy.  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   Rotator cuff tear, right     Patient Active Problem List   Diagnosis Date Noted   Sinus tachycardia 05/22/2015   Chest pain 05/20/2015   Bilateral conjunctivitis    Pain in the chest    Essential hypertension    Type 2 diabetes mellitus without complication, without long-term current use of insulin (HCC)    Alcoholism (HCC)    Diabetes mellitus without complication (HCC)    GERD (gastroesophageal reflux disease)    Hypertension    Gastroesophageal reflux disease without esophagitis    Major  depressive disorder (HCC) 04/25/2013   PTSD (post-traumatic stress disorder) 04/25/2013   Alcohol dependence (HCC) 04/24/2013   Alcohol withdrawal (HCC) 04/24/2013    Past Surgical History:  Procedure Laterality Date   ABDOMINAL HYSTERECTOMY     CHOLECYSTECTOMY     ESOPHAGOGASTRODUODENOSCOPY (EGD) WITH PROPOFOL N/A 03/26/2015   Procedure: ESOPHAGOGASTRODUODENOSCOPY (EGD) WITH PROPOFOL;  Surgeon: Iva Booparl E Gessner, MD;  Location: [facility] ENDOSCOPY;  Service: Endoscopy;  Laterality: N/A;   GASTRIC BYPASS     SHOULDER ARTHROSCOPY WITH ROTATOR CUFF REPAIR Right 08/09/2017   Procedure: RIGHT SHOULDER ARTHROSCOPY WITH ROTATOR CUFF REPAIR, SUBACROMIAL DECOMPRESSION, DISTAL CLAVICAL EXCISION, DEBRIDEMENT OF LABRAL CYST;  Surgeon: Jones Broomhandler, Justin, MD;  Location: [facility] OR;  Service: Orthopedics;  Laterality: Right;   TONSILLECTOMY       OB History   No obstetric history on file.      Home Medications    Prior to Admission medications   Medication Sig Start Date End Date Taking? Authorizing Provider  acyclovir (ZOVIRAX) 400 MG tablet Take 800 mg by mouth daily.    Yes [provider]  ascorbic acid (VITAMIN C) 500 MG tablet Take 500 mg by mouth daily.   Yes [provider]  aspirin EC 81 MG tablet Take 1 tablet (81 mg total) by mouth daily. 05/22/15  Yes Casey BurkittFitzgerald, Hillary Moen, MD  Aspirin-Salicylamide-Caffeine [facility](BC HEADACHE POWDER PO) Take 1 Package by mouth daily as needed (shoulder pain).   Yes [provider]  atorvastatin (LIPITOR) 20 MG tablet Take 20 mg by mouth daily. 05/27/15  Yes [provider]  cholecalciferol (VITAMIN D) 1000 units tablet Take 2,000 Units by mouth daily.    Yes [provider]  Cyanocobalamin (B-12) 1000 MCG TABS Take 2,000 mcg by mouth daily.    Yes [provider]  metFORMIN (GLUCOPHAGE) 500 MG tablet Take 1 tablet (500 mg total) by mouth 3 (three) times daily. 04/28/13  Yes Niel Hummer, NP  metoprolol  succinate (TOPROL-XL) 25 MG 24 hr tablet Take 25 mg by mouth 2 (two) times daily.   Yes [provider]  Multiple Vitamin (MULTIVITAMIN WITH MINERALS) TABS tablet Take 1 tablet by mouth every morning.    Yes [provider]  SUMAtriptan (IMITREX) 50 MG tablet Take 50 mg by mouth daily as needed for migraine. 10/20/16  Yes [provider]  topiramate (TOPAMAX) 100 MG tablet Take 200 mg by mouth at bedtime.  03/03/15  Yes [provider]    Family History Family History  Problem Relation Age of Onset   Diabetes Mother    Hypertension Mother    Diabetes Father    Hypertension Father    Diabetes Brother    Hypertension Brother    Diabetes Sister     Social History Social History   Tobacco Use   Smoking status: Never Smoker   Smokeless tobacco: Never Used  Substance Use Topics   Alcohol use: No    Comment: recovering alcoholic- last drink 11/4707   Drug use: No     Allergies   Lisinopril, Morphine and related, Peach [prunus persica], Sulfa antibiotics, Sulfasalazine, Tape, and Latex   Review of Systems Review of Systems  Constitutional: Negative for chills and fever.  HENT: Negative for congestion, ear discharge, ear pain, sinus pressure, sinus pain and sore throat.   Eyes: Negative for pain and redness.  Respiratory: Positive for cough. Negative for shortness of breath.   Cardiovascular: Negative for chest pain.  Gastrointestinal: Positive for abdominal distention, abdominal pain, constipation and nausea. Negative for diarrhea and vomiting.  Genitourinary: Negative for dysuria and hematuria.  Musculoskeletal: Negative for back pain and neck pain.  Skin: Negative for wound.  Neurological: Negative for weakness, numbness and headaches.     Physical Exam Updated Vital Signs BP 104/82    Pulse 81    Temp 97.9 F (36.6 C) (Oral)    Resp 16    SpO2 100%   Physical Exam Vitals signs and nursing note reviewed.  Constitutional:       General: She is not in acute distress.    Appearance: She is not ill-appearing.  HENT:     Head: Normocephalic and atraumatic.     Right Ear: Tympanic membrane and external ear normal.     Left Ear: Tympanic membrane and external ear normal.     Nose: Nose normal.     Mouth/Throat:     Mouth: Mucous membranes are moist.     Pharynx: Oropharynx is clear.  Eyes:     General: No scleral icterus.       Right eye: No discharge.        Left eye: No discharge.     Extraocular Movements: Extraocular movements intact.     Conjunctiva/sclera: Conjunctivae normal.     Pupils: Pupils are equal, round, and reactive to light.  Neck:     Musculoskeletal: Normal range of motion.     Vascular: No JVD.  Cardiovascular:  Rate and Rhythm: Normal rate and regular rhythm.     Pulses: Normal pulses.          Radial pulses are 2+ on the right side and 2+ on the left side.     Heart sounds: Normal heart sounds.  Pulmonary:     Comments: Lungs clear to auscultation in all fields. Symmetric chest rise. No wheezing, rales, or rhonchi. Abdominal:     General: A surgical scar is present. Bowel sounds are normal.     Comments: Abdomen is soft, mildly distended with generalized tenderness. no rigidity, no guarding. No peritoneal signs.  Musculoskeletal: Normal range of motion.  Skin:    General: Skin is warm and dry.     Capillary Refill: Capillary refill takes less than 2 seconds.  Neurological:     Mental Status: She is oriented to person, place, and time.     GCS: GCS eye subscore is 4. GCS verbal subscore is 5. GCS motor subscore is 6.     Comments: Fluent speech, no facial droop.  Psychiatric:        Behavior: Behavior normal.      ED Treatments / Results  Labs (all labs ordered are listed, but only abnormal results are displayed) Labs Reviewed  COMPREHENSIVE METABOLIC PANEL - Abnormal; Notable for the following components:      Result Value   Glucose, Bld 165 (*)    Alkaline  Phosphatase 134 (*)    All other components within normal limits  URINALYSIS, ROUTINE W REFLEX MICROSCOPIC - Abnormal; Notable for the following components:   Leukocytes,Ua TRACE (*)    All other components within normal limits  LIPASE, BLOOD  CBC    EKG None  Radiology Ct Abdomen Pelvis W Contrast  Result Date: 09/25/2018 CLINICAL DATA:  Abdominal pain and distension. History of gastric bypass and cholecystectomy. EXAM: CT ABDOMEN AND PELVIS WITH CONTRAST TECHNIQUE: Multidetector CT imaging of the abdomen and pelvis was performed using the standard protocol following bolus administration of intravenous contrast. CONTRAST:  100mL OMNIPAQUE IOHEXOL 300 MG/ML  SOLN COMPARISON:  CT 05/03/2017 FINDINGS: Lower chest: Lung bases are clear. Hepatobiliary: No focal liver abnormality is seen. Status post cholecystectomy. No biliary dilatation. Pancreas: No ductal dilatation or inflammation. Spleen: Normal in size without focal abnormality. Adrenals/Urinary Tract: Normal adrenal glands. No hydronephrosis or perinephric edema. Homogeneous renal enhancement with symmetric excretion on delayed phase imaging. Lobulated renal contours. Subcentimeter low-density in the upper right kidney, too small to characterize. Urinary bladder is physiologically distended without wall thickening. Stomach/Bowel: Post gastric bypass with small hiatal hernia, stable from prior. Excluded gastric remnant is decompressed. The Roux limb is nondilated. Jejunal anastomosis is slightly patulous but no wall thickening or obstruction. There is no small bowel wall thickening or inflammatory change. Normal terminal ileum. Appendix not visualized. Moderate stool burden throughout the colon. No colonic wall thickening or inflammatory change. Slip position of splenic flexure under the left hemidiaphragm, unchanged. Vascular/Lymphatic: Mild aortic atherosclerosis. No aneurysm. Portal vein is patent. No acute vascular findings. No abdominopelvic  adenopathy. Reproductive: Status post hysterectomy. No adnexal masses. Other: No free air or free fluid. Postsurgical changes the anterior abdominal wall without hernia. 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 There are no acute or suspicious osseous abnormalities. IMPRESSION: 1. No acute abnormality. 2. Post gastric bypass with small hiatal hernia, unchanged from prior. No bowel obstruction or inflammation. 3. Moderate colonic stool burden, can be seen with constipation. Aortic Atherosclerosis (ICD10-I70.0). Electronically  Signed   By: Narda RutherfordMelanie  Sanford M.D.   On: 09/25/2018 21:17    Procedures Procedures (including critical care time)  Medications Ordered in ED Medications  sodium chloride flush (NS) 0.9 % injection 3 mL (has no administration in time range)  alum & mag hydroxide-simeth (MAALOX/MYLANTA) 200-200-20 MG/5ML suspension 30 mL (30 mLs Oral Given 09/25/18 1741)  iohexol (OMNIPAQUE) 300 MG/ML solution 100 mL (100 mLs Intravenous Contrast Given 09/25/18 2104)     Initial Impression / Assessment and Plan / ED Course  I have reviewed the triage vital signs and the nursing notes.  Pertinent labs & imaging results that were available during my care of the patient were reviewed by me and considered in my medical decision making (see chart for details).  Patient is afebrile, nontoxic, nonseptic appearing, in no apparent distress.  On exam she has generalized abdominal tenderness without peritoneal signs.  Given surgical history of gastric bypass and tenderness on exam will CT abdomen pelvis.  DDX includes indication of constipationappendicitis, bowel obstruction, bowel perforation, diverticulitis.   Patient's pain and other symptoms adequately managed in emergency department.  Labs, imaging and vitals reviewed.  Patient does not meet the SIRS or Sepsis criteria.  On repeat exam patient does not have a surgical abdomin and there are no peritoneal  signs.  CT abdomen pelvis shows stool burden, no acute findings.  Once to patient no acute findings no  Patient is tolerating fluids and saltine crackers in ER without any vomiting throughout ER stay. Patient discharged home with symptomatic treatment and given strict instructions for follow-up with their primary care physician.  I have also discussed reasons to return immediately to the ER.  Patient expresses understanding and agrees with plan.  This note was prepared using Dragon voice recognition software and may include unintentional dictation errors due to the inherent limitations of voice recognition software.     Final Clinical Impressions(s) / ED Diagnoses   Final diagnoses:  Abdominal pain, unspecified abdominal location    ED Discharge Orders    None       Kathyrn Lasslbrizze, Oliviana Mcgahee E, PA-C 09/25/18 2215    Arby BarrettePfeiffer, Marcy, MD 09/26/18 [phone]

## 2018-09-25 NOTE — ED Notes (Signed)
Attempted IV without success.

## 2019-07-27 ENCOUNTER — Other Ambulatory Visit: Payer: Self-pay

## 2019-07-27 ENCOUNTER — Emergency Department (HOSPITAL_COMMUNITY)
Admission: EM | Admit: 2019-07-27 | Discharge: 2019-07-27 | Disposition: A | Discharge status: Home / Self Care | Payer: BLUE CROSS/BLUE SHIELD | Attending: Emergency Medicine | Admitting: Emergency Medicine

## 2019-07-27 DIAGNOSIS — R1032 Left lower quadrant pain: Secondary | ICD-10-CM | POA: Diagnosis present

## 2019-07-27 DIAGNOSIS — E119 Type 2 diabetes mellitus without complications: Secondary | ICD-10-CM | POA: Insufficient documentation

## 2019-07-27 DIAGNOSIS — I10 Essential (primary) hypertension: Secondary | ICD-10-CM | POA: Insufficient documentation

## 2019-07-27 DIAGNOSIS — N12 Tubulo-interstitial nephritis, not specified as acute or chronic: Secondary | ICD-10-CM

## 2019-07-27 DIAGNOSIS — N10 Acute tubulo-interstitial nephritis: Secondary | ICD-10-CM | POA: Insufficient documentation

## 2019-07-27 LAB — URINALYSIS, ROUTINE W REFLEX MICROSCOPIC
Bacteria, UA: NONE SEEN
Bilirubin Urine: NEGATIVE
Glucose, UA: NEGATIVE mg/dL
Ketones, ur: NEGATIVE mg/dL
Nitrite: POSITIVE — AB
Protein, ur: 100 mg/dL — AB
Specific Gravity, Urine: 1.008 (ref 1.005–1.030)
WBC, UA: >50 WBC/hpf — ABNORMAL HIGH (ref 0–5)
pH: 6 (ref 5.0–8.0)

## 2019-07-27 MED ORDER — CEPHALEXIN 500 MG PO CAPS: 500.0000 mg | ORAL_CAPSULE | Freq: Four times a day (QID) | ORAL | 0 refills | Status: AC

## 2019-07-27 MED ORDER — PHENAZOPYRIDINE HCL 200 MG PO TABS: 200.0000 mg | ORAL_TABLET | Freq: Three times a day (TID) | ORAL | 0 refills | Status: AC | PRN

## 2019-07-27 NOTE — ED Provider Notes (Signed)
[facility]-EMERGENCY DEPT [facility] Emergency Department Provider Note MRN:  [id number]  Arrival date & time: 07/27/19     Chief Complaint   Abdominal Pain   History of Present Illness   Janice Santos is a 54 y.o. year-old female with a history of hypertension, diabetes presenting to the ED with chief complaint of abdominal pain.  1 week ago patient noticed gradual onset bilateral flank pain wrapping around to the suprapubic region.  Pain in the suprapubic region has worsened.  She has also noticed a foul smell to her urine, discomfort with urination, frequent urination, feeling pressure-like sensation when urinating.  She denies bowel incontinence, no urinary retention, no numbness or weakness to the arms or legs, no trauma.  She has been trying cranberry juice and increasing her fluid intake but the pain continues.  She denies fever, no chest pain or shortness of breath.  Symptoms are mild to moderate, constant.  Review of Systems  A complete 10 system review of systems was obtained and all systems are negative except as noted in the HPI and PMH.   Patient's Health History    Past Medical History:  Diagnosis Date  . Alcoholism (HCC)   . Cyst of right clavicle    Labral  . Diabetes mellitus without complication (HCC)    Type II  . GERD (gastroesophageal reflux disease)   . Hypertension   . Migraines   . 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 Rotator cuff tear, right     Past Surgical History:  Procedure Laterality Date  . ABDOMINAL HYSTERECTOMY    . CHOLECYSTECTOMY    . ESOPHAGOGASTRODUODENOSCOPY (EGD) WITH PROPOFOL N/A 03/26/2015   Procedure: ESOPHAGOGASTRODUODENOSCOPY (EGD) WITH PROPOFOL;  Surgeon: Iva Boop, MD;  Location: [facility] ENDOS[facility];  Service: Endoscopy;  Laterality: N/A;  . GASTRIC BYPASS    . SHOULDER ARTHROSCOPY WITH ROTATOR CUFF REPAIR Right 08/09/2017   Procedure: RIGHT SHOULDER ARTHROSCOPY WITH ROTATOR CUFF REPAIR,  SUBACROMIAL DECOMPRESSION, DISTAL CLAVICAL EXCISION, DEBRIDEMENT OF LABRAL CYST;  Surgeon: Jones Broom, MD;  Location: [facility] OR;  Service: Orthopedics;  Laterality: Right;  . TONSILLECTOMY      Family History  Problem Relation Age of Onset  . Diabetes Mother   . Hypertension Mother   . Diabetes Father   . Hypertension Father   . Diabetes Brother   . Hypertension Brother   . Diabetes Sister     Social History   Socioeconomic History  . Marital status: Widowed    Spouse name: Not on file  . Number of children: Not on file  . Years of education: Not on file  . Highest education level: Not on file  Occupational History  . Not on file  Tobacco Use  . Smoking status: Never Smoker  . Smokeless tobacco: Never Used  Substance and Sexual Activity  . Alcohol use: No    Comment: recovering alcoholic- last drink 06/2014  . Drug use: No  . Sexual activity: Never    Birth control/protection: Surgical  Other Topics Concern  . Not on file  Social History Narrative   ** Merged History Encounter **       Social Determinants of Health   Financial Resource Strain:   . Difficulty of Paying Living Expenses:   Food Insecurity:   . Worried About Programme researcher, broadcasting/film/video in the Last Year:   . Barista in the Last Year:   Transportation Needs:   . Lack of Transportation (  Medical):   Marland Kitchen Lack of Transportation (Non-Medical):   Physical Activity:   . Days of Exercise per Week:   . Minutes of Exercise per Session:   Stress:   . Feeling of Stress :   Social Connections:   . Frequency of Communication with Friends and Family:   . Frequency of Social Gatherings with Friends and Family:   . Attends Religious Services:   . Active Member of Clubs or Organizations:   . Attends Archivist Meetings:   Marland Kitchen Marital Status:   Intimate Partner Violence:   . Fear of Current or Ex-Partner:   . Emotionally Abused:   Marland Kitchen Physically Abused:   . Sexually Abused:      Physical Exam    Vitals:   07/27/19 1157  BP: 138/78  Pulse: 67  Resp: 18  Temp: 98.9 F (37.2 C)  SpO2: 100%    CONSTITUTIONAL: Well-appearing, NAD NEURO:  Alert and oriented x 3, no focal deficits EYES:  eyes equal and reactive ENT/NECK:  no LAD, no JVD CARDIO: Regular rate, well-perfused, normal S1 and S2 PULM:  CTAB no wheezing or rhonchi GI/GU:  normal bowel sounds, non-distended, mild suprapubic tenderness palpation, mild left CVA tenderness MSK/SPINE:  No gross deformities, no edema SKIN:  no rash, atraumatic PSYCH:  Appropriate speech and behavior  *Additional and/or pertinent findings included in MDM below  Diagnostic and Interventional Summary    Labs Reviewed  URINALYSIS, ROUTINE W REFLEX MICROSCOPIC - Abnormal; Notable for the following components:      Result Value   APPearance CLOUDY (*)    Hgb urine dipstick MODERATE (*)    Protein, ur 100 (*)    Nitrite POSITIVE (*)    Leukocytes,Ua LARGE (*)    WBC, UA >50 (*)    All other components within normal limits  URINE CULTURE    No orders to display    Medications - No data to display   Procedures  /  Critical Care Procedures  ED Course and Medical Decision Making  I have reviewed the triage vital signs, the nursing notes, and pertinent available records from the EMR.  Listed above are laboratory and imaging tests that I personally ordered, reviewed, and interpreted and then considered in my medical decision making (see below for details).      Suspect urinary tract infection, possibly pyelonephritis.  Gradual onset, doubt kidney stone.  No right lower quadrant tenderness.  No signs of systemic illness, very reassuring appearance and vital signs.  If urinalysis confirms infection, will treat with Keflex and Pyridium.  Urinalysis consistent with infection, appropriate for discharge.  Barth Kirks. Sedonia Small, [place] [email or web address]  Final Clinical Impressions(s)  / ED Diagnoses     ICD-10-CM   1. Pyelonephritis  N12     ED Discharge Orders         Ordered    cephALEXin (KEFLEX) 500 MG capsule  4 times daily     07/27/19 1323    phenazopyridine (PYRIDIUM) 200 MG tablet  3 times daily PRN     07/27/19 1323           Discharge Instructions Discussed with and Provided to Patient:     Discharge Instructions     You were evaluated in the Emergency Department and after careful evaluation, we did not find any emergent condition requiring admission or further testing in the hospital.  Your exam/testing today was overall reassuring.  Your symptoms seem to  be due to a urinary tract infection and possibly a kidney infection.  It is important that you take the Keflex antibiotic as directed.  We recommend Tylenol 1000 mg every 4-6 hours as well as Motrin 600 mg every 4-6 hours for discomfort.  You can also use the Pyridium to help with your discomfort.  Please return to the Emergency Department if you experience any worsening of your condition.  We encourage you to follow up with a primary care provider.  Thank you for allowing [place] to be a part of your care.        Sabas Sous, MD 07/27/19 1325

## 2019-07-27 NOTE — Discharge Instructions (Addendum)
You were evaluated in the Emergency Department and after careful evaluation, we did not find any emergent condition requiring admission or further testing in the hospital.  Your exam/testing today was overall reassuring.  Your symptoms seem to be due to a urinary tract infection and possibly a kidney infection.  It is important that you take the Keflex antibiotic as directed.  We recommend Tylenol 1000 mg every 4-6 hours as well as Motrin 600 mg every 4-6 hours for discomfort.  You can also use the Pyridium to help with your discomfort.  Please return to the Emergency Department if you experience any worsening of your condition.  We encourage you to follow up with a primary care provider.  Thank you for allowing [place] to be a part of your care.

## 2019-07-27 NOTE — ED Triage Notes (Signed)
Patient reports one week ago she developed a lower abd pain that wrapped around her back. Patient says now her urine smells very foul.

## 2019-07-29 LAB — URINE CULTURE: Culture: >=100000 — AB

## 2019-07-30 ENCOUNTER — Telehealth: Payer: Self-pay

## 2019-07-30 NOTE — Telephone Encounter (Signed)
Post ED Visit - Positive Culture Follow-up  Culture report reviewed by antimicrobial stewardship pharmacist: [facility] []  Nathan Batchelder, Pharm.D. []  , Pharm.D., BCPS AQ-ID []  , Pharm.D., BCPS []  Celedonio Miyamoto, Pharm.D., BCPS []  [place], Garvin Fila.D., BCPS, AAHIVP []  , Pharm.D., BCPS, AAHIVP []  Georgina Pillion, PharmD, BCPS []  , PharmD, BCPS []  [place], PharmD, BCPS []  [address], PharmD []  , PharmD, BCPS []  Estella Husk, PharmD  Pharmacy Team []  Lysle Pearl, PharmD [x]  , PharmD []  Phillips Climes, PharmD []  , Rph []  Agapito Games) , PharmD []  Verlan Friends, PharmD []  , PharmD []  Mervyn Gay, PharmD []  , PharmD []  Vinnie Level, PharmD []  [facility], PharmD []  , PharmD []  Len Childs, PharmD   Positive urine culture Treated with Cephalexin, organism sensitive to the same and no further patient follow-up is required at this time.  07/30/2019, 11:57 AM

## 2021-03-03 IMAGING — CT CT ABDOMEN AND PELVIS WITH CONTRAST
2 of 5 series · 16 of 46 positions shown, 18 images · IV contrast (APPLIED)
Comparison: CT 05/03/2017

CLINICAL DATA: Abdominal pain and distension. History of gastric
bypass and cholecystectomy.

EXAM:
CT ABDOMEN AND PELVIS WITH CONTRAST
TECHNIQUE: Multidetector CT imaging of the abdomen and pelvis was performed
using the standard protocol following bolus administration of
intravenous contrast.
CONTRAST:  100mL OMNIPAQUE IOHEXOL 300 MG/ML  SOLN

[Series 3: abdomen 5.0 · axial · 0.92mm/px · z∈[-383,+7]mm · 13 of 91 slices shown, 15 images]
[im 7/91  soft-tissue]
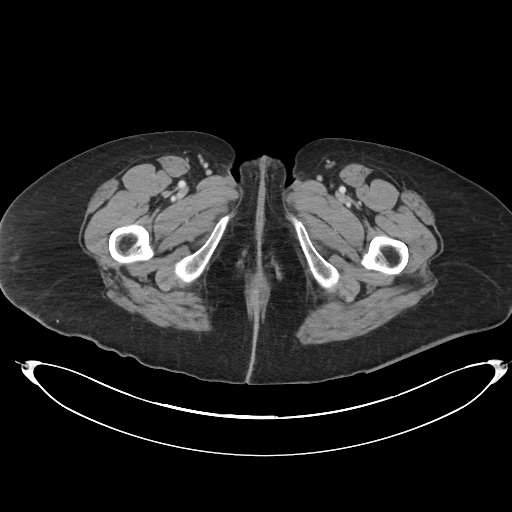
[im 7/91  bone]
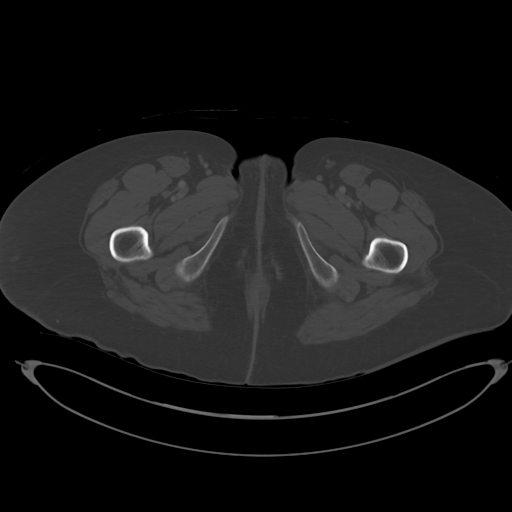
[im 13/91  soft-tissue]
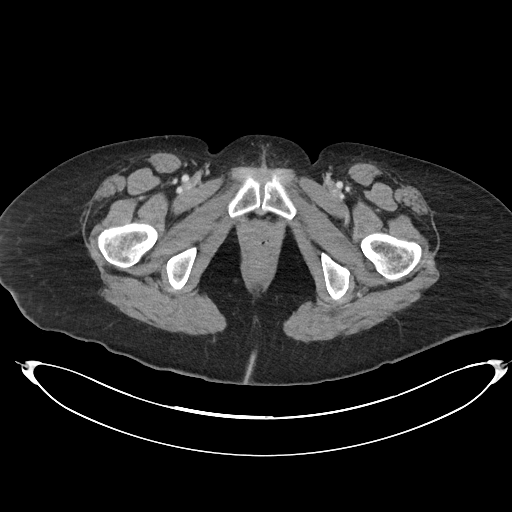
[im 19/91  soft-tissue]
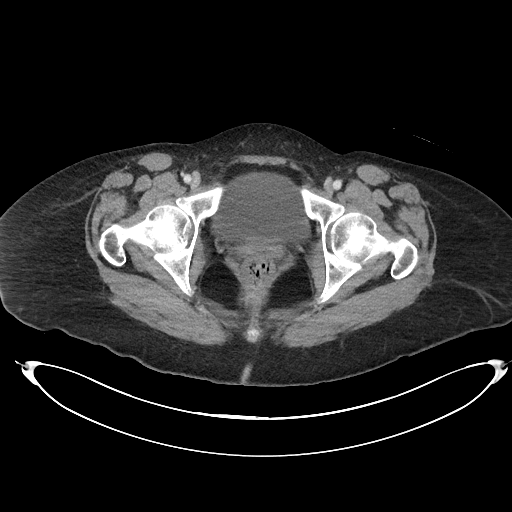
[im 25/91  soft-tissue]
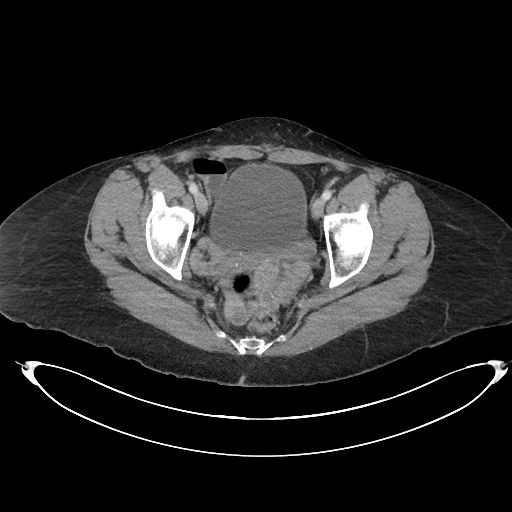
[im 31/91  soft-tissue]
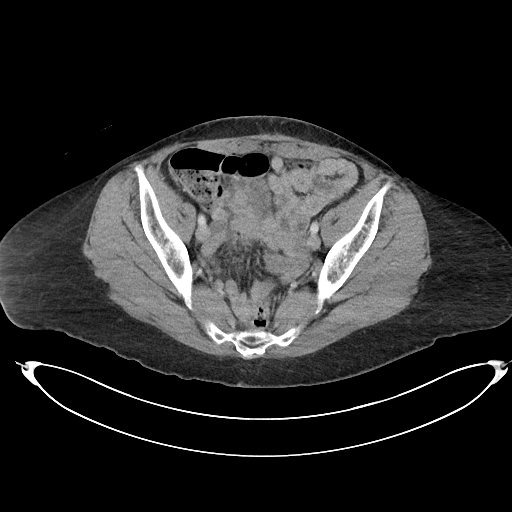
[im 37/91  soft-tissue]
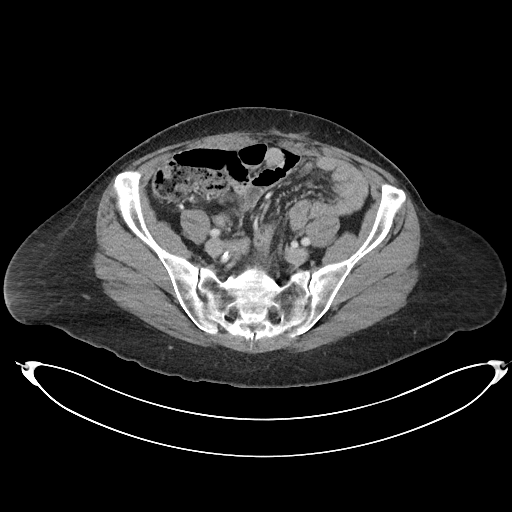
[im 49/91  soft-tissue]
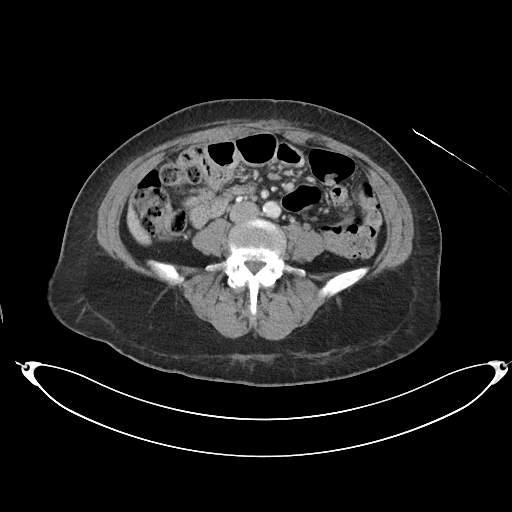
[im 55/91  soft-tissue]
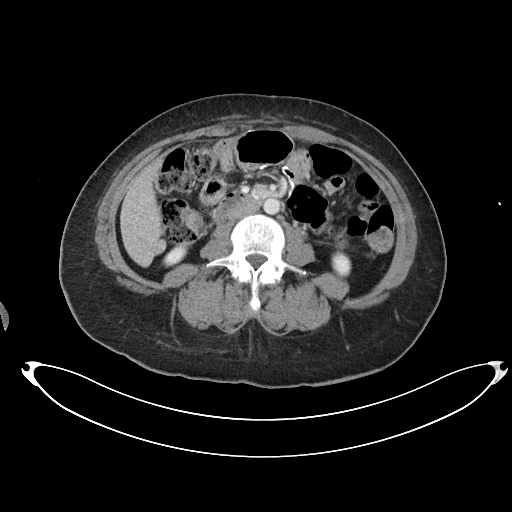
[im 61/91  soft-tissue]
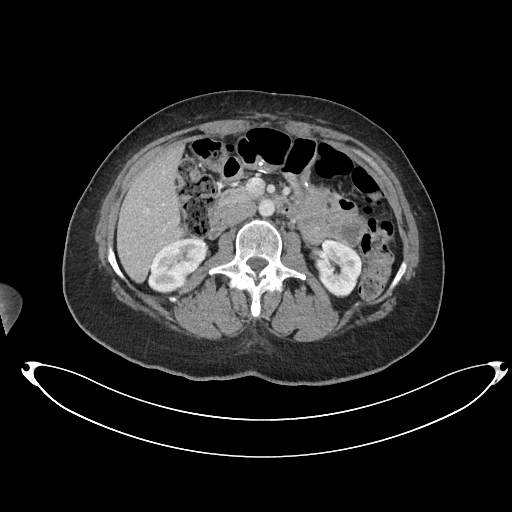
[im 61/91  bone]
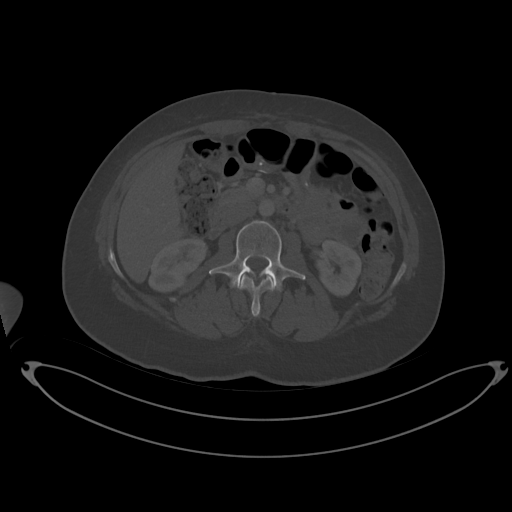
[im 67/91  soft-tissue]
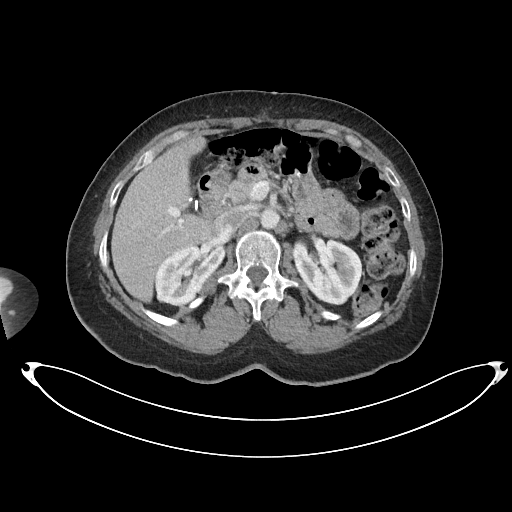
[im 73/91  soft-tissue]
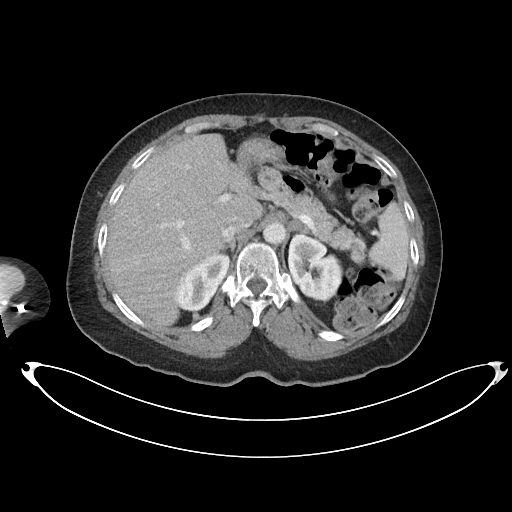
[im 79/91  soft-tissue]
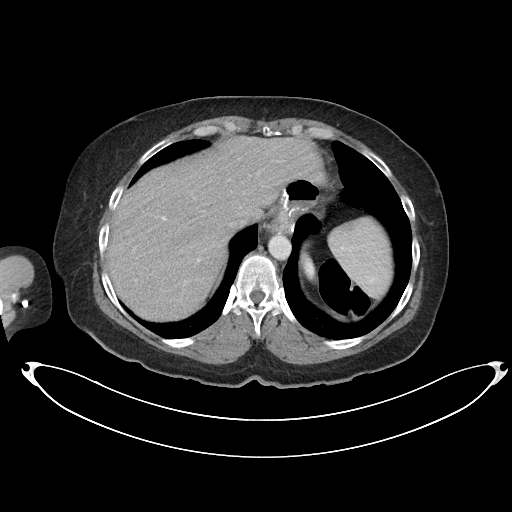
[im 85/91  soft-tissue]
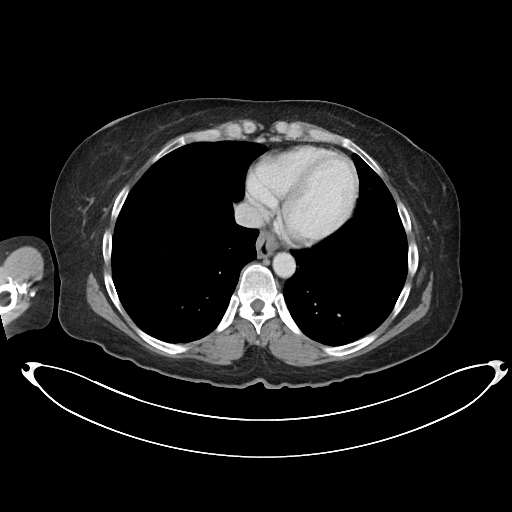

[Series 6: abdomen 3.0 mpr cor · coronal · 0.89mm/px · 3 of 107 slices shown]
[im 36/107  soft-tissue]
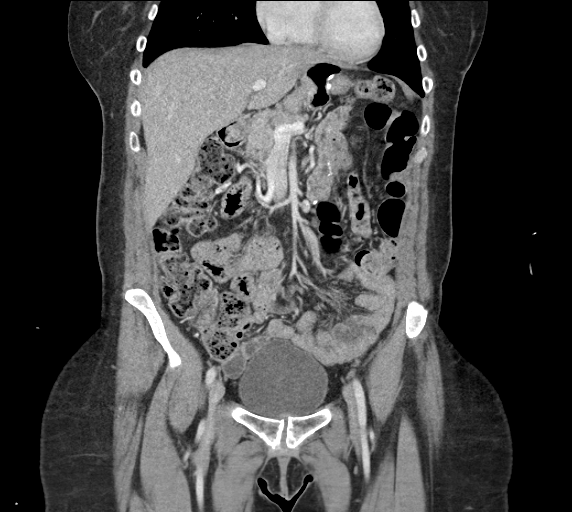
[im 48/107  soft-tissue]
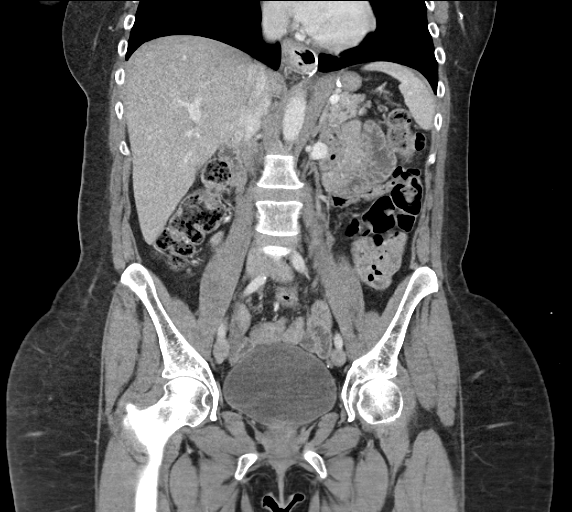
[im 59/107  soft-tissue]
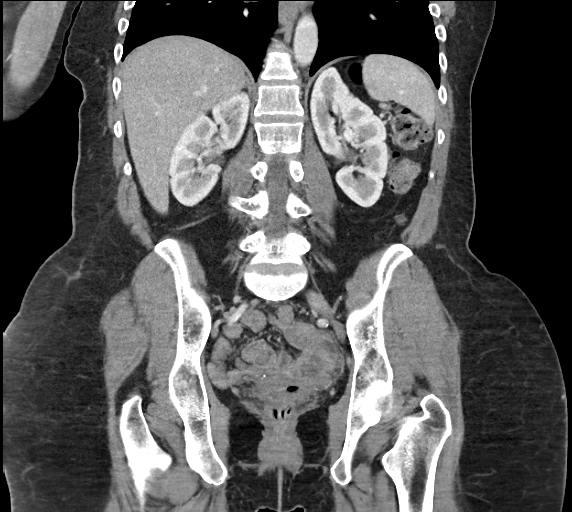

[16 of 46 positions shown; findings below may reference images not displayed]

FINDINGS: Lower chest: Lung bases are clear.

Hepatobiliary: No focal liver abnormality is seen. Status post
cholecystectomy. No biliary dilatation.

Pancreas: No ductal dilatation or inflammation.

Spleen: Normal in size without focal abnormality.

Adrenals/Urinary Tract: Normal adrenal glands. No hydronephrosis or
perinephric edema. Homogeneous renal enhancement with symmetric
excretion on delayed phase imaging. Lobulated renal contours.
Subcentimeter low-density in the upper right kidney, too small to
characterize. Urinary bladder is physiologically distended without
wall thickening.

Stomach/Bowel: Post gastric bypass with small hiatal hernia, stable
from prior. Excluded gastric remnant is decompressed. The Roux limb
is nondilated. Jejunal anastomosis is slightly patulous but no wall
thickening or obstruction. There is no small bowel wall thickening
or inflammatory change. Normal terminal ileum. Appendix not
visualized. Moderate stool burden throughout the colon. No colonic
wall thickening or inflammatory change. Slip position of splenic
flexure under the left hemidiaphragm, unchanged.

Vascular/Lymphatic: Mild aortic atherosclerosis. No aneurysm. Portal
vein is patent. No acute vascular findings. No abdominopelvic
adenopathy.

Reproductive: Status post hysterectomy. No adnexal masses.

Other: No free air or free fluid. Postsurgical changes the anterior
abdominal wall without hernia.

Musculoskeletal: A vascular necrosis of the right femoral head,
chronic and unchanged. There are no acute or suspicious osseous
abnormalities.
IMPRESSION: 1. No acute abnormality.
2. Post gastric bypass with small hiatal hernia, unchanged from
prior. No bowel obstruction or inflammation.
3. Moderate colonic stool burden, can be seen with constipation.

Aortic Atherosclerosis ([id number]-[id number].X).
